# Patient Record
Sex: Female | Born: 1985 | Race: White | Hispanic: No | Marital: Married | Smoking: Never smoker
Health system: Southern US, Community
[De-identification: ages and names within clinical notes are randomized; demographics above are authoritative.]

## PROBLEM LIST (undated history)

## (undated) ENCOUNTER — Inpatient Hospital Stay (HOSPITAL_COMMUNITY): Payer: Self-pay

## (undated) DIAGNOSIS — J45909 Unspecified asthma, uncomplicated: Secondary | ICD-10-CM

## (undated) DIAGNOSIS — I10 Essential (primary) hypertension: Secondary | ICD-10-CM

## (undated) DIAGNOSIS — E119 Type 2 diabetes mellitus without complications: Secondary | ICD-10-CM

## (undated) HISTORY — PX: TONSILLECTOMY AND ADENOIDECTOMY: SHX28

---

## 2018-06-29 ENCOUNTER — Inpatient Hospital Stay (HOSPITAL_COMMUNITY)
Admission: AD | Admit: 2018-06-29 | Discharge: 2018-06-29 | Disposition: A | Discharge status: Home / Self Care | Payer: 59 | Source: Ambulatory Visit | Attending: Obstetrics and Gynecology | Admitting: Obstetrics and Gynecology

## 2018-06-29 ENCOUNTER — Encounter (HOSPITAL_COMMUNITY): Payer: Self-pay | Admitting: *Deleted

## 2018-06-29 DIAGNOSIS — O133 Gestational [pregnancy-induced] hypertension without significant proteinuria, third trimester: Secondary | ICD-10-CM | POA: Diagnosis present

## 2018-06-29 DIAGNOSIS — Z3A31 31 weeks gestation of pregnancy: Secondary | ICD-10-CM | POA: Diagnosis not present

## 2018-06-29 DIAGNOSIS — Z0371 Encounter for suspected problem with amniotic cavity and membrane ruled out: Secondary | ICD-10-CM

## 2018-06-29 DIAGNOSIS — O219 Vomiting of pregnancy, unspecified: Secondary | ICD-10-CM

## 2018-06-29 LAB — URINALYSIS, ROUTINE W REFLEX MICROSCOPIC
BILIRUBIN URINE: NEGATIVE
Glucose, UA: NEGATIVE mg/dL
Hgb urine dipstick: NEGATIVE
Ketones, ur: NEGATIVE mg/dL
LEUKOCYTES UA: NEGATIVE
NITRITE: NEGATIVE
PH: 7 (ref 5.0–8.0)
Protein, ur: NEGATIVE mg/dL
SPECIFIC GRAVITY, URINE: 1.006 (ref 1.005–1.030)

## 2018-06-29 LAB — COMPREHENSIVE METABOLIC PANEL
ALT: 13 U/L (ref 0–44)
AST: 17 U/L (ref 15–41)
Albumin: 2.5 g/dL — ABNORMAL LOW (ref 3.5–5.0)
Alkaline Phosphatase: 124 U/L (ref 38–126)
Anion gap: 9 (ref 5–15)
BUN: 5 mg/dL — AB (ref 6–20)
CHLORIDE: 107 mmol/L (ref 98–111)
CO2: 20 mmol/L — AB (ref 22–32)
CREATININE: 0.58 mg/dL (ref 0.44–1.00)
Calcium: 8.3 mg/dL — ABNORMAL LOW (ref 8.9–10.3)
Glucose, Bld: 81 mg/dL (ref 70–99)
Potassium: 3.6 mmol/L (ref 3.5–5.1)
Sodium: 136 mmol/L (ref 135–145)
Total Bilirubin: 0.3 mg/dL (ref 0.3–1.2)
Total Protein: 5.8 g/dL — ABNORMAL LOW (ref 6.5–8.1)

## 2018-06-29 LAB — CBC
HCT: 30.8 % — ABNORMAL LOW (ref 36.0–46.0)
HEMOGLOBIN: 10.1 g/dL — AB (ref 12.0–15.0)
MCH: 29.3 pg (ref 26.0–34.0)
MCHC: 32.8 g/dL (ref 30.0–36.0)
MCV: 89.3 fL (ref 78.0–100.0)
PLATELETS: 177 10*3/uL (ref 150–400)
RBC: 3.45 MIL/uL — AB (ref 3.87–5.11)
RDW: 13.8 % (ref 11.5–15.5)
WBC: 9.6 10*3/uL (ref 4.0–10.5)

## 2018-06-29 LAB — POCT FERN TEST: POCT Fern Test: NEGATIVE

## 2018-06-29 LAB — PROTEIN / CREATININE RATIO, URINE
CREATININE, URINE: 69 mg/dL
Protein Creatinine Ratio: 0.13 mg/mg{Cre} (ref 0.00–0.15)
Total Protein, Urine: 9 mg/dL

## 2018-06-29 MED ORDER — ACETAMINOPHEN 500 MG PO TABS
1000.0000 mg | ORAL_TABLET | Freq: Once | ORAL | Status: AC
  Administered 2018-06-29: 1000 mg via ORAL
  Filled 2018-06-29: qty 2

## 2018-06-29 NOTE — MAU Note (Addendum)
Ongoing, intermittent swelling, increased since last night and is hurting hands and legs  N/V, emesis x2 last night  SOB  Headache since last night, did not try any medication  +FM, reports underwear was wet this AM unsure if discharge or SROM

## 2018-06-29 NOTE — Discharge Instructions (Signed)
Hypertension During Pregnancy °Hypertension, commonly called high blood pressure, is when the force of blood pumping through your arteries is too strong. Arteries are blood vessels that carry blood from the heart throughout the body. Hypertension during pregnancy can cause problems for you and your baby. Your baby may be born early (prematurely) or may not weigh as much as he or she should at birth. Very bad cases of hypertension during pregnancy can be life-threatening. °Different types of hypertension can occur during pregnancy. These include: °· Chronic hypertension. This happens when: °? You have hypertension before pregnancy and it continues during pregnancy. °? You develop hypertension before you are [redacted] weeks pregnant, and it continues during pregnancy. °· Gestational hypertension. This is hypertension that develops after the 20th week of pregnancy. °· Preeclampsia, also called toxemia of pregnancy. This is a very serious type of hypertension that develops only during pregnancy. It affects the whole body, and it can be very dangerous for you and your baby. ° °Gestational hypertension and preeclampsia usually go away within 6 weeks after your baby is born. Women who have hypertension during pregnancy have a greater chance of developing hypertension later in life or during future pregnancies. °What are the causes? °The exact cause of hypertension is not known. °What increases the risk? °There are certain factors that make it more likely for you to develop hypertension during pregnancy. These include: °· Having hypertension during a previous pregnancy or prior to pregnancy. °· Being overweight. °· Being older than age 40. °· Being pregnant for the first time or being pregnant with more than one baby. °· Becoming pregnant using fertilization methods such as IVF (in vitro fertilization). °· Having diabetes, kidney problems, or systemic lupus erythematosus. °· Having a family history of hypertension. ° °What are the  signs or symptoms? °Chronic hypertension and gestational hypertension rarely cause symptoms. Preeclampsia causes symptoms, which may include: °· Increased protein in your urine. Your health care provider will check for this at every visit before you give birth (prenatal visit). °· Severe headaches. °· Sudden weight gain. °· Swelling of the hands, face, legs, and feet. °· Nausea and vomiting. °· Vision problems, such as blurred or double vision. °· Numbness in the face, arms, legs, and feet. °· Dizziness. °· Slurred speech. °· Sensitivity to bright lights. °· Abdominal pain. °· Convulsions. ° °How is this diagnosed? °You may be diagnosed with hypertension during a routine prenatal exam. At each prenatal visit, you may: °· Have a urine test to check for high amounts of protein in your urine. °· Have your blood pressure checked. A blood pressure reading is recorded as two numbers, such as "120 over 80" (or 120/80). The first ("top") number is called the systolic pressure. It is a measure of the pressure in your arteries when your heart beats. The second ("bottom") number is called the diastolic pressure. It is a measure of the pressure in your arteries as your heart relaxes between beats. Blood pressure is measured in a unit called mm Hg. A normal blood pressure reading is: °? Systolic: below 120. °? Diastolic: below 80. ° °The type of hypertension that you are diagnosed with depends on your test results and when your symptoms developed. °· Chronic hypertension is usually diagnosed before 20 weeks of pregnancy. °· Gestational hypertension is usually diagnosed after 20 weeks of pregnancy. °· Hypertension with high amounts of protein in the urine is diagnosed as preeclampsia. °· Blood pressure measurements that stay above 160 systolic, or above 110 diastolic, are   signs of severe preeclampsia. ° °How is this treated? °Treatment for hypertension during pregnancy varies depending on the type of hypertension you have and how  serious it is. °· If you take medicines called ACE inhibitors to treat chronic hypertension, you may need to switch medicines. ACE inhibitors should not be taken during pregnancy. °· If you have gestational hypertension, you may need to take blood pressure medicine. °· If you are at risk for preeclampsia, your health care provider may recommend that you take a low-dose aspirin every day to prevent high blood pressure during your pregnancy. °· If you have severe preeclampsia, you may need to be hospitalized so you and your baby can be monitored closely. You may also need to take medicine (magnesium sulfate) to prevent seizures and to lower blood pressure. This medicine may be given as an injection or through an IV tube. °· In some cases, if your condition gets worse, you may need to deliver your baby early. ° °Follow these instructions at home: °Eating and drinking °· Drink enough fluid to keep your urine clear or pale yellow. °· Eat a healthy diet that is low in salt (sodium). Do not add salt to your food. Check food labels to see how much sodium a food or beverage contains. °Lifestyle °· Do not use any products that contain nicotine or tobacco, such as cigarettes and e-cigarettes. If you need help quitting, ask your health care provider. °· Do not use alcohol. °· Avoid caffeine. °· Avoid stress as much as possible. Rest and get plenty of sleep. °General instructions °· Take over-the-counter and prescription medicines only as told by your health care provider. °· While lying down, lie on your left side. This keeps pressure off your baby. °· While sitting or lying down, raise (elevate) your feet. Try putting some pillows under your lower legs. °· Exercise regularly. Ask your health care provider what kinds of exercise are best for you. °· Keep all prenatal and follow-up visits as told by your health care provider. This is important. °Contact a health care provider if: °· You have symptoms that your health care  provider told you may require more treatment or monitoring, such as: °? Fever. °? Vomiting. °? Headache. °Get help right away if: °· You have severe abdominal pain or vomiting that does not get better with treatment. °· You suddenly develop swelling in your hands, ankles, or face. °· You gain 4 lbs (1.8 kg) or more in 1 week. °· You develop vaginal bleeding, or you have blood in your urine. °· You do not feel your baby moving as much as usual. °· You have blurred or double vision. °· You have muscle twitching or sudden tightening (spasms). °· You have shortness of breath. °· Your lips or fingernails turn blue. °This information is not intended to replace advice given to you by your health care provider. Make sure you discuss any questions you have with your health care provider. °Document Released: 06/26/2011 Document Revised: 04/27/2016 Document Reviewed: 03/23/2016 °Elsevier Interactive Patient Education © 2018 [employer] Inc. ° °

## 2018-06-29 NOTE — MAU Provider Note (Signed)
Chief Complaint:  Swelling; Shortness of Breath; Nausea; and Headache   First Provider Initiated Contact with Patient 06/29/18 1153     HPI: Paige Moreno is a 32 y.o. G2P0010 at [redacted]w[redacted]d who presents to maternity admissions reporting: 1. Moderate generalized HA since last night. Hasn't tried anything to Tx it  2. Swelling of  hands and feet. 3. Waking up w/ damp underwear. No leaking sine then 4. N/V x 2 last night accompanied by sensation of difficulty immediately before vomiting that resolved after vomiting. No N/V or difficulty breathing since then  Associated signs and symptoms: Neg for fever, chills, vision changes, epigastric pain, VB, contractions, vaginal discharge.   Good fetal movement.   Pregnancy Course: Had some mildly elevated BP's and Nml Pre-e labs.   Past Medical History:  Diagnosis Date  . Medical history non-contributory    OB History  Gravida Para Term Preterm AB Living  2       1    SAB TAB Ectopic Multiple Live Births  1            # Outcome Date GA Lbr Len/2nd Weight Sex Delivery Anes PTL Lv  2 Current           1 SAB 06/22/17           Past Surgical History:  Procedure Laterality Date  . TONSILLECTOMY AND ADENOIDECTOMY     History reviewed. No pertinent family history. Social History   Tobacco Use  . Smoking status: Never Smoker  . Smokeless tobacco: Never Used  Substance Use Topics  . Alcohol use: Never    Frequency: Never  . Drug use: Never   No Known Allergies No medications prior to admission.    I have reviewed patient's Past Medical Hx, Surgical Hx, Family Hx, Social Hx, medications and allergies.   ROS:  Review of Systems  Constitutional: Negative for chills and fever.  Eyes: Negative for photophobia and visual disturbance.  Respiratory: Negative for chest tightness.        Difficulty coordinating breathing w/ vomiting. Reported to RN as SOB, but not really C/W with true SOB. None since last episode of emesis.   Cardiovascular:  Positive for leg swelling. Negative for chest pain.  Gastrointestinal: Positive for nausea and vomiting. Negative for abdominal pain, constipation and diarrhea.  Genitourinary: Negative for vaginal bleeding and vaginal discharge.       Possible LOF.   Neurological: Positive for headaches. Negative for dizziness and speech difficulty.    Physical Exam   Patient Vitals for the past 24 hrs:  BP Temp Temp src Pulse Resp SpO2 Weight  06/29/18 1231 124/83 - - 90 - - -  06/29/18 1216 (!) 120/104 - - (!) 107 - - -  06/29/18 1201 (!) 127/103 - - (!) 109 - - -  06/29/18 1146 121/77 - - 91 - - -  06/29/18 1131 121/78 - - 94 - - -  06/29/18 1126 132/88 - - 90 - - -  06/29/18 1109 (!) 141/80 97.9 F (36.6 C) Oral (!) 108 20 97 % 91.2 kg   Constitutional: Well-developed, well-nourished female in no acute distress.  Cardiovascular: normal rate and mile tachycardia Respiratory: normal effort and rate GI: Abd soft, non-tender, gravid appropriate for gestational age. MS: Extremities nontender, 2+ pedal edema, normal ROM Neurologic: Alert and oriented x 4.  GU: Pelvic: NEFG, physiologic discharge, no blood, neg pooling, cervix clean, visually closed.     FHT:  Baseline 150 , moderate variability,  accelerations present, no decelerations Contractions: UI   Labs: Results for orders placed or performed during the hospital encounter of 06/29/18 (from the past 24 hour(s))  Urinalysis, Routine w reflex microscopic     Status: Abnormal   Collection Time: 06/29/18 11:30 AM  Result Value Ref Range   Color, Urine YELLOW YELLOW   APPearance HAZY (A) CLEAR   Specific Gravity, Urine 1.006 1.005 - 1.030   pH 7.0 5.0 - 8.0   Glucose, UA NEGATIVE NEGATIVE mg/dL   Hgb urine dipstick NEGATIVE NEGATIVE   Bilirubin Urine NEGATIVE NEGATIVE   Ketones, ur NEGATIVE NEGATIVE mg/dL   Protein, ur NEGATIVE NEGATIVE mg/dL   Nitrite NEGATIVE NEGATIVE   Leukocytes, UA NEGATIVE NEGATIVE  Protein / creatinine ratio, urine      Status: None   Collection Time: 06/29/18 11:30 AM  Result Value Ref Range   Creatinine, Urine 69.00 mg/dL   Total Protein, Urine 9 mg/dL   Protein Creatinine Ratio 0.13 0.00 - 0.15 mg/mg[Cre]  CBC     Status: Abnormal   Collection Time: 06/29/18 11:58 AM  Result Value Ref Range   WBC 9.6 4.0 - 10.5 K/uL   RBC 3.45 (L) 3.87 - 5.11 MIL/uL   Hemoglobin 10.1 (L) 12.0 - 15.0 g/dL   HCT 40.9 (L) 81.1 - 91.4 %   MCV 89.3 78.0 - 100.0 fL   MCH 29.3 26.0 - 34.0 pg   MCHC 32.8 30.0 - 36.0 g/dL   RDW 78.2 95.6 - 21.3 %   Platelets 177 150 - 400 K/uL  Comprehensive metabolic panel     Status: Abnormal   Collection Time: 06/29/18 11:58 AM  Result Value Ref Range   Sodium 136 135 - 145 mmol/L   Potassium 3.6 3.5 - 5.1 mmol/L   Chloride 107 98 - 111 mmol/L   CO2 20 (L) 22 - 32 mmol/L   Glucose, Bld 81 70 - 99 mg/dL   BUN 5 (L) 6 - 20 mg/dL   Creatinine, Ser 0.86 0.44 - 1.00 mg/dL   Calcium 8.3 (L) 8.9 - 10.3 mg/dL   Total Protein 5.8 (L) 6.5 - 8.1 g/dL   Albumin 2.5 (L) 3.5 - 5.0 g/dL   AST 17 15 - 41 U/L   ALT 13 0 - 44 U/L   Alkaline Phosphatase 124 38 - 126 U/L   Total Bilirubin 0.3 0.3 - 1.2 mg/dL   GFR calc non Af Amer >60 >60 mL/min   GFR calc Af Amer >60 >60 mL/min   Anion gap 9 5 - 15  POCT fern test     Status: None   Collection Time: 06/29/18 12:12 PM  Result Value Ref Range   POCT Fern Test Negative = intact amniotic membranes     Imaging:  No results found.  MAU Course: Orders Placed This Encounter  Procedures  . Urinalysis, Routine w reflex microscopic  . CBC  . Comprehensive metabolic panel  . Protein / creatinine ratio, urine  . Vital signs  . Encourage fluids  . POCT fern test  . Discharge patient   Meds ordered this encounter  Medications  . acetaminophen (TYLENOL) tablet 1,000 mg   Discussed Hx, labs, BPs, exam w/ Dr. Dr. Ellyn Hack. Agrees w/ POC. New orders: None.   MDM: - Mildly elevated BP's w/ Nml Pre-E labs and HA that resolved w/ Tylenol C/W  CHTN. Swelling likely related. No evidence of DVT.  - No evidence of ROM. Sensation of damp underwear may have been from leukorrhea.  PROM precautions. Reviewed.  - Sporadic N/V x ~1 month. Declines meds. Doubt infectious etiology or other emergent condition. Possibly 2/2 reflux. Discussed meds, diet.   Assessment: 1. Gestational hypertension, third trimester   2. No leakage of amniotic fluid into vagina   3. Pregnancy related nausea and vomiting, antepartum     Plan: Discharge home in stable condition per consult w/ Dr. Ellyn Hack.  Preterm Labor precautions, Pre-E precautions and fetal kick counts Follow-up Information    [facility] Follow up on 06/30/2018.   Contact information: [address]  [address] [place] [address] [phone]        [facility] [place] MATERNITY ADMISSIONS Follow up.   Why:  as needed if symptoms worsen Contact information: [address] [id number] mc [place] [address] [phone]          Allergies as of 06/29/2018   No Known Allergies     Medication List    You have not been prescribed any medications.     [place], [place], CNM 06/29/2018 2:24 PM

## 2018-07-15 ENCOUNTER — Inpatient Hospital Stay (HOSPITAL_COMMUNITY)
Admission: AD | Admit: 2018-07-15 | Discharge: 2018-07-15 | Disposition: A | Discharge status: Home / Self Care | Payer: 59 | Source: Ambulatory Visit | Attending: Obstetrics and Gynecology | Admitting: Obstetrics and Gynecology

## 2018-07-15 ENCOUNTER — Other Ambulatory Visit: Payer: Self-pay

## 2018-07-15 ENCOUNTER — Encounter (HOSPITAL_COMMUNITY): Payer: Self-pay

## 2018-07-15 DIAGNOSIS — Z3A33 33 weeks gestation of pregnancy: Secondary | ICD-10-CM | POA: Insufficient documentation

## 2018-07-15 DIAGNOSIS — O133 Gestational [pregnancy-induced] hypertension without significant proteinuria, third trimester: Secondary | ICD-10-CM | POA: Diagnosis not present

## 2018-07-15 DIAGNOSIS — R030 Elevated blood-pressure reading, without diagnosis of hypertension: Secondary | ICD-10-CM | POA: Diagnosis present

## 2018-07-15 HISTORY — DX: Unspecified asthma, uncomplicated: J45.909

## 2018-07-15 LAB — COMPREHENSIVE METABOLIC PANEL
ALT: 15 U/L (ref 0–44)
AST: 23 U/L (ref 15–41)
Albumin: 2.7 g/dL — ABNORMAL LOW (ref 3.5–5.0)
Alkaline Phosphatase: 140 U/L — ABNORMAL HIGH (ref 38–126)
Anion gap: 8 (ref 5–15)
BUN: 7 mg/dL (ref 6–20)
CHLORIDE: 109 mmol/L (ref 98–111)
CO2: 22 mmol/L (ref 22–32)
Calcium: 8.6 mg/dL — ABNORMAL LOW (ref 8.9–10.3)
Creatinine, Ser: 0.52 mg/dL (ref 0.44–1.00)
GFR calc Af Amer: >60 mL/min (ref >60)
Glucose, Bld: 82 mg/dL (ref 70–99)
POTASSIUM: 4.2 mmol/L (ref 3.5–5.1)
SODIUM: 139 mmol/L (ref 135–145)
Total Bilirubin: 0.5 mg/dL (ref 0.3–1.2)
Total Protein: 5.7 g/dL — ABNORMAL LOW (ref 6.5–8.1)

## 2018-07-15 LAB — CBC WITH DIFFERENTIAL/PLATELET
Basophils Absolute: 0 10*3/uL (ref 0.0–0.1)
Basophils Relative: 0 %
EOS PCT: 1 %
Eosinophils Absolute: 0.1 10*3/uL (ref 0.0–0.7)
HCT: 31.6 % — ABNORMAL LOW (ref 36.0–46.0)
Hemoglobin: 10.4 g/dL — ABNORMAL LOW (ref 12.0–15.0)
LYMPHS ABS: 1.7 10*3/uL (ref 0.7–4.0)
LYMPHS PCT: 19 %
MCH: 29.1 pg (ref 26.0–34.0)
MCHC: 32.9 g/dL (ref 30.0–36.0)
MCV: 88.3 fL (ref 78.0–100.0)
MONO ABS: 0.5 10*3/uL (ref 0.1–1.0)
Monocytes Relative: 6 %
Neutro Abs: 6.7 10*3/uL (ref 1.7–7.7)
Neutrophils Relative %: 74 %
PLATELETS: 174 10*3/uL (ref 150–400)
RBC: 3.58 MIL/uL — AB (ref 3.87–5.11)
RDW: 14.5 % (ref 11.5–15.5)
WBC: 9 10*3/uL (ref 4.0–10.5)

## 2018-07-15 LAB — URINALYSIS, ROUTINE W REFLEX MICROSCOPIC
Bilirubin Urine: NEGATIVE
Glucose, UA: NEGATIVE mg/dL
Hgb urine dipstick: NEGATIVE
KETONES UR: NEGATIVE mg/dL
LEUKOCYTES UA: NEGATIVE
NITRITE: NEGATIVE
PH: 7 (ref 5.0–8.0)
Protein, ur: NEGATIVE mg/dL
SPECIFIC GRAVITY, URINE: 1.008 (ref 1.005–1.030)

## 2018-07-15 LAB — PROTEIN / CREATININE RATIO, URINE
CREATININE, URINE: 63 mg/dL
Protein Creatinine Ratio: 0.14 mg/mg{Cre} (ref 0.00–0.15)
TOTAL PROTEIN, URINE: 9 mg/dL

## 2018-07-15 MED ORDER — LABETALOL HCL 100 MG PO TABS
200.0000 mg | ORAL_TABLET | Freq: Once | ORAL | Status: AC
  Administered 2018-07-15: 200 mg via ORAL
  Filled 2018-07-15: qty 2

## 2018-07-15 MED ORDER — ONDANSETRON 8 MG PO TBDP
8.0000 mg | ORAL_TABLET | Freq: Once | ORAL | Status: AC
  Administered 2018-07-15: 8 mg via ORAL
  Filled 2018-07-15: qty 1

## 2018-07-15 MED ORDER — LABETALOL HCL 5 MG/ML IV SOLN
20.0000 mg | INTRAVENOUS | Status: DC | PRN
  Administered 2018-07-15: 20 mg via INTRAVENOUS
  Filled 2018-07-15: qty 4

## 2018-07-15 MED ORDER — LACTATED RINGERS IV SOLN
INTRAVENOUS | Status: DC
  Administered 2018-07-15: 18:00:00 via INTRAVENOUS

## 2018-07-15 MED ORDER — ACETAMINOPHEN 500 MG PO TABS
1000.0000 mg | ORAL_TABLET | Freq: Once | ORAL | Status: AC
  Administered 2018-07-15: 1000 mg via ORAL
  Filled 2018-07-15: qty 2

## 2018-07-15 MED ORDER — LABETALOL HCL 200 MG PO TABS: 200.0000 mg | ORAL_TABLET | Freq: Two times a day (BID) | ORAL | 1 refills | Status: DC

## 2018-07-15 MED ORDER — LABETALOL HCL 5 MG/ML IV SOLN: 80.0000 mg | INTRAVENOUS | Status: DC | PRN

## 2018-07-15 MED ORDER — HYDRALAZINE HCL 20 MG/ML IJ SOLN: 10.0000 mg | INTRAMUSCULAR | Status: DC | PRN

## 2018-07-15 MED ORDER — LABETALOL HCL 5 MG/ML IV SOLN
40.0000 mg | INTRAVENOUS | Status: DC | PRN
  Filled 2018-07-15: qty 8

## 2018-07-15 NOTE — Discharge Instructions (Signed)
Hypertension During Pregnancy °Hypertension is also called high blood pressure. High blood pressure means that the force of your blood moving in your body is too strong. When you are pregnant, this condition should be watched carefully. It can cause problems for you and your baby. °Follow these instructions at home: °Eating and drinking °· Drink enough fluid to keep your pee (urine) clear or pale yellow. °· Eat healthy foods that are low in salt (sodium). °? Do not add salt to your food. °? Check labels on foods and drinks to see much salt is in them. Look on the label where you see "Sodium." °Lifestyle °· Do not use any products that contain nicotine or tobacco, such as cigarettes and e-cigarettes. If you need help quitting, ask your doctor. °· Do not use alcohol. °· Avoid caffeine. °· Avoid stress. Rest and get plenty of sleep. °General instructions °· Take over-the-counter and prescription medicines only as told by your doctor. °· While lying down, lie on your left side. This keeps pressure off your baby. °· While sitting or lying down, raise (elevate) your feet. Try putting some pillows under your lower legs. °· Exercise regularly. Ask your doctor what kinds of exercise are best for you. °· Keep all prenatal and follow-up visits as told by your doctor. This is important. °Contact a doctor if: °· You have symptoms that your doctor told you to watch for, such as: °? Fever. °? Throwing up (vomiting). °? Headache. °Get help right away if: °· You have very bad pain in your belly (abdomen). °· You are throwing up, and this does not get better with treatment. °· You suddenly get swelling in your hands, ankles, or face. °· You gain 4 lb (1.8 kg) or more in 1 week. °· You get bleeding from your vagina. °· You have blood in your pee. °· You do not feel your baby moving as much as normal. °· You have a change in vision. °· You have muscle twitching or sudden tightening (spasms). °· You have trouble breathing. °· Your lips  or fingernails turn blue. °This information is not intended to replace advice given to you by your health care provider. Make sure you discuss any questions you have with your health care provider. °Document Released: 11/10/2010 Document Revised: 06/19/2016 Document Reviewed: 06/19/2016 °Elsevier Interactive Patient Education © 2018 [employer] Inc[employer] °Preeclampsia and Eclampsia °Preeclampsia is a serious condition that develops only during pregnancy. It is also called toxemia of pregnancy. This condition causes high blood pressure along with other symptoms, such as swelling and headaches. These symptoms may develop as the condition gets worse. Preeclampsia may occur at 20 weeks of pregnancy or later. °Diagnosing and treating preeclampsia early is very important. If not treated early, it can cause serious problems for you and your baby. One problem it can lead to is eclampsia, which is a condition that causes muscle jerking or shaking (convulsions or seizures) in the mother. Delivering your baby is the best treatment for preeclampsia or eclampsia. Preeclampsia and eclampsia symptoms usually go away after your baby is born. °What are the causes? °The cause of preeclampsia is not known. °What increases the risk? °The following risk factors make you more likely to develop preeclampsia: °· Being pregnant for the first time. °· Having had preeclampsia during a past pregnancy. °· Having a family history of preeclampsia. °· Having high blood pressure. °· Being pregnant with twins or triplets. °· Being 35 or older. °· Being African-American. °· Having kidney disease or diabetes. °·   Having medical conditions such as lupus or blood diseases. °· Being very overweight (obese). ° °What are the signs or symptoms? °The earliest signs of preeclampsia are: °· High blood pressure. °· Increased protein in your urine. Your health care provider will check for this at every visit before you give birth (prenatal visit). ° °Other symptoms that  may develop as the condition gets worse include: °· Severe headaches. °· Sudden weight gain. °· Swelling of the hands, face, legs, and feet. °· Nausea and vomiting. °· Vision problems, such as blurred or double vision. °· Numbness in the face, arms, legs, and feet. °· Urinating less than usual. °· Dizziness. °· Slurred speech. °· Abdominal pain, especially upper abdominal pain. °· Convulsions or seizures. ° °Symptoms generally go away after giving birth. °How is this diagnosed? °There are no screening tests for preeclampsia. Your health care provider will ask you about symptoms and check for signs of preeclampsia during your prenatal visits. You may also have tests that include: °· Urine tests. °· Blood tests. °· Checking your blood pressure. °· Monitoring your baby’s heart rate. °· Ultrasound. ° °How is this treated? °You and your health care provider will determine the treatment approach that is best for you. Treatment may include: °· Having more frequent prenatal exams to check for signs of preeclampsia, if you have an increased risk for preeclampsia. °· Bed rest. °· Reducing how much salt (sodium) you eat. °· Medicine to lower your blood pressure. °· Staying in the hospital, if your condition is severe. There, treatment will focus on controlling your blood pressure and the amount of fluids in your body (fluid retention). °· You may need to take medicine (magnesium sulfate) to prevent seizures. This medicine may be given as an injection or through an IV tube. °· Delivering your baby early, if your condition gets worse. You may have your labor started with medicine (induced), or you may have a cesarean delivery. ° °Follow these instructions at home: °Eating and drinking ° °· Drink enough fluid to keep your urine clear or pale yellow. °· Eat a healthy diet that is low in sodium. Do not add salt to your food. Check nutrition labels to see how much sodium a food or beverage contains. °· Avoid  caffeine. °Lifestyle °· Do not use any products that contain nicotine or tobacco, such as cigarettes and e-cigarettes. If you need help quitting, ask your health care provider. °· Do not use alcohol or drugs. °· Avoid stress as much as possible. Rest and get plenty of sleep. °General instructions °· Take over-the-counter and prescription medicines only as told by your health care provider. °· When lying down, lie on your side. This keeps pressure off of your baby. °· When sitting or lying down, raise (elevate) your feet. Try putting some pillows underneath your lower legs. °· Exercise regularly. Ask your health care provider what kinds of exercise are best for you. °· Keep all follow-up and prenatal visits as told by your health care provider. This is important. °How is this prevented? °To prevent preeclampsia or eclampsia from developing during another pregnancy: °· Get proper medical care during pregnancy. Your health care provider may be able to prevent preeclampsia or diagnose and treat it early. °· Your health care provider may have you take a low-dose aspirin or a calcium supplement during your next pregnancy. °· You may have tests of your blood pressure and kidney function after giving birth. °· Maintain a healthy weight. Ask your health care provider for   help managing weight gain during pregnancy. °· Work with your health care provider to manage any long-term (chronic) health conditions you have, such as diabetes or kidney problems. ° °Contact a health care provider if: °· You gain more weight than expected. °· You have headaches. °· You have nausea or vomiting. °· You have abdominal pain. °· You feel dizzy or light-headed. °Get help right away if: °· You develop sudden or severe swelling anywhere in your body. This usually happens in the legs. °· You gain 5 lbs (2.3 kg) or more during one week. °· You have severe: °? Abdominal pain. °? Headaches. °? Dizziness. °? Vision problems. °? Confusion. °? Nausea or  vomiting. °· You have a seizure. °· You have trouble moving any part of your body. °· You develop numbness in any part of your body. °· You have trouble speaking. °· You have any abnormal bleeding. °· You pass out. °This information is not intended to replace advice given to you by your health care provider. Make sure you discuss any questions you have with your health care provider. °Document Released: 10/05/2000 Document Revised: 06/05/2016 Document Reviewed: 05/14/2016 °Elsevier Interactive Patient Education © 2018 Elsevier Inc. ° °

## 2018-07-15 NOTE — MAU Note (Signed)
Feels really nauseous, when stands sees spotty dots, feels dizzy, has a HA.   Increased swelling in lower extremities. Ankles tight and red. (pitting edema noted)- swelling has been going down for the last 2 wks.   BP has been up and down.  Sent over from dr's office

## 2018-07-15 NOTE — MAU Provider Note (Signed)
History     CSN: [id number]  Arrival date and time: 07/15/18 1604   First Provider Initiated Contact with Patient 07/15/18 1641      Chief Complaint  Patient presents with  . Headache  . Nausea  . Leg Swelling  . Dizziness  . visual changes   HPI  Ms. Paige Moreno is a 32 y.o. G2P0010 at [redacted]w[redacted]d who presents to MAU today from the office for further evaluation of elevated blood pressure. The patient also has mild headache. She denies floaters or blurred vision. She has noted intermittent upper abdominal pain and increasing LE edema. She was diagnosed with GHTN after her last MAU visit on 06/29/18. She is not currently on any anti-hypertensive medications.   OB History    Gravida  2   Para      Term      Preterm      AB  1   Living        SAB  1   TAB      Ectopic      Multiple      Live Births              Past Medical History:  Diagnosis Date  . Asthma     Past Surgical History:  Procedure Laterality Date  . TONSILLECTOMY AND ADENOIDECTOMY      History reviewed. No pertinent family history.  Social History   Tobacco Use  . Smoking status: Never Smoker  . Smokeless tobacco: Never Used  Substance Use Topics  . Alcohol use: Never    Frequency: Never  . Drug use: Never    Allergies: No Known Allergies  No medications prior to admission.    Review of Systems  Constitutional: Negative for fever.  Eyes: Negative for visual disturbance.  Cardiovascular: Positive for leg swelling.  Gastrointestinal: Positive for abdominal pain, nausea and vomiting. Negative for constipation and diarrhea.  Genitourinary: Positive for vaginal discharge. Negative for vaginal bleeding.  Neurological: Positive for headaches.   Physical Exam   Blood pressure (!) 138/95, pulse 85, temperature 98.6 F (37 C), temperature source Oral, resp. rate 20, weight 96.5 kg, SpO2 100 %.  Physical Exam  Nursing note and vitals reviewed. Constitutional: She is oriented to  person, place, and time. She appears well-developed and well-nourished. No distress.  HENT:  Head: Normocephalic and atraumatic.  Cardiovascular: Normal rate.  Respiratory: Effort normal.  GI: Soft. She exhibits no distension and no mass. There is tenderness (mild epigastric tenderness to palpation). There is no rebound and no guarding.  Musculoskeletal: She exhibits edema (2+ pitting edema to the mid shin bilaterally).  Neurological: She is alert and oriented to person, place, and time. She has normal reflexes.  No clonus  Skin: Skin is warm and dry. No erythema.  Psychiatric: She has a normal mood and affect.     Results for orders placed or performed during the hospital encounter of 07/15/18 (from the past 24 hour(s))  CBC with Differential/Platelet     Status: Abnormal   Collection Time: 07/15/18  4:49 PM  Result Value Ref Range   WBC 9.0 4.0 - 10.5 K/uL   RBC 3.58 (L) 3.87 - 5.11 MIL/uL   Hemoglobin 10.4 (L) 12.0 - 15.0 g/dL   HCT 78.2 (L) 95.6 - 21.3 %   MCV 88.3 78.0 - 100.0 fL   MCH 29.1 26.0 - 34.0 pg   MCHC 32.9 30.0 - 36.0 g/dL   RDW 08.6 57.8 - 46.9 %  Platelets 174 150 - 400 K/uL   Neutrophils Relative % 74 %   Neutro Abs 6.7 1.7 - 7.7 K/uL   Lymphocytes Relative 19 %   Lymphs Abs 1.7 0.7 - 4.0 K/uL   Monocytes Relative 6 %   Monocytes Absolute 0.5 0.1 - 1.0 K/uL   Eosinophils Relative 1 %   Eosinophils Absolute 0.1 0.0 - 0.7 K/uL   Basophils Relative 0 %   Basophils Absolute 0.0 0.0 - 0.1 K/uL  Comprehensive metabolic panel     Status: Abnormal   Collection Time: 07/15/18  4:49 PM  Result Value Ref Range   Sodium 139 135 - 145 mmol/L   Potassium 4.2 3.5 - 5.1 mmol/L   Chloride 109 98 - 111 mmol/L   CO2 22 22 - 32 mmol/L   Glucose, Bld 82 70 - 99 mg/dL   BUN 7 6 - 20 mg/dL   Creatinine, Ser 1.610.52 0.44 - 1.00 mg/dL   Calcium 8.6 (L) 8.9 - 10.3 mg/dL   Total Protein 5.7 (L) 6.5 - 8.1 g/dL   Albumin 2.7 (L) 3.5 - 5.0 g/dL   AST 23 15 - 41 U/L   ALT 15 0 -  44 U/L   Alkaline Phosphatase 140 (H) 38 - 126 U/L   Total Bilirubin 0.5 0.3 - 1.2 mg/dL   GFR calc non Af Amer >60 >60 mL/min   GFR calc Af Amer >60 >60 mL/min   Anion gap 8 5 - 15  Protein / creatinine ratio, urine     Status: None   Collection Time: 07/15/18  4:56 PM  Result Value Ref Range   Creatinine, Urine 63.00 mg/dL   Total Protein, Urine 9 mg/dL   Protein Creatinine Ratio 0.14 0.00 - 0.15 mg/mg[Cre]  Urinalysis, Routine w reflex microscopic     Status: None   Collection Time: 07/15/18  4:56 PM  Result Value Ref Range   Color, Urine YELLOW YELLOW   APPearance CLEAR CLEAR   Specific Gravity, Urine 1.008 1.005 - 1.030   pH 7.0 5.0 - 8.0   Glucose, UA NEGATIVE NEGATIVE mg/dL   Hgb urine dipstick NEGATIVE NEGATIVE   Bilirubin Urine NEGATIVE NEGATIVE   Ketones, ur NEGATIVE NEGATIVE mg/dL   Protein, ur NEGATIVE NEGATIVE mg/dL   Nitrite NEGATIVE NEGATIVE   Leukocytes, UA NEGATIVE NEGATIVE    Fetal Monitoring: Baseline: 130 bpm Variability: moderate Accelerations: 10 x 10, 15 x 15 Decelerations: none Contractions: none  Patient Vitals for the past 24 hrs:  BP Temp Temp src Pulse Resp SpO2 Weight  07/15/18 1756 (!) 138/95 - - 85 - - -  07/15/18 1751 (!) 134/94 - - 85 - - -  07/15/18 1716 (!) 152/112 - - (!) 103 - - -  07/15/18 1701 (!) 149/96 - - (!) 101 - - -  07/15/18 1646 (!) 148/110 - - (!) 105 - - -  07/15/18 1626 (!) 150/101 98.6 F (37 C) Oral 99 20 100 % 96.5 kg    MAU Course  Procedures None  MDM UA, urine protein/creatinine ratio, CBC, CMP  Serial BPs Second severe range BP. HTN protocol initiated.  Zofran ODT for nausea and 1 G Tylenol for headache given Patient reports resolution of headache.  Discussed patient with Dr. Jackelyn KnifeMeisinger. Start on Labetalol 200 mg BID. Follow-up in the office Thursday for BP check.   Assessment and Plan  A: SIUP at 4162w6d GTHN  P:  Discharge home Rx for Labetalol given to patient  Pre-eclampsia precautions  discussed Patient advised to follow-up with [facility] on Thursday for BP check and re-evaluation.  Patient may return to MAU as needed or if her condition were to change or worsen  Vonzella Nipple, PA-C 07/15/2018, 6:17 PM

## 2018-07-16 ENCOUNTER — Ambulatory Visit: Payer: 59 | Admitting: Registered"

## 2018-07-16 ENCOUNTER — Encounter: Payer: Self-pay | Admitting: *Deleted

## 2018-07-16 ENCOUNTER — Ambulatory Visit (INDEPENDENT_AMBULATORY_CARE_PROVIDER_SITE_OTHER): Payer: 59 | Admitting: *Deleted

## 2018-07-16 MED ORDER — BETAMETHASONE SOD PHOS & ACET 6 (3-3) MG/ML IJ SUSP
12.0000 mg | INTRAMUSCULAR | Status: AC
  Administered 2018-07-16 – 2018-07-17 (×2): 12 mg via INTRAMUSCULAR

## 2018-07-17 ENCOUNTER — Ambulatory Visit (INDEPENDENT_AMBULATORY_CARE_PROVIDER_SITE_OTHER): Payer: 59 | Admitting: General Practice

## 2018-07-17 VITALS — BP 142/74 | HR 123

## 2018-07-17 DIAGNOSIS — O09213 Supervision of pregnancy with history of pre-term labor, third trimester: Secondary | ICD-10-CM

## 2018-07-17 NOTE — Progress Notes (Signed)
Paige Moreno here for Betamethasone  Injection.  Injection administered without complication. Patient will follow up at [facility] provider as previously scheduled.  Marylynn Pearson, RN 07/17/2018  2:29 PM

## 2018-07-17 NOTE — Progress Notes (Signed)
Patient seen and assessed by nursing staff.  Agree with documentation and plan.  

## 2018-07-18 ENCOUNTER — Encounter (HOSPITAL_COMMUNITY): Payer: Self-pay

## 2018-07-18 ENCOUNTER — Inpatient Hospital Stay (EMERGENCY_DEPARTMENT_HOSPITAL)
Admission: AD | Admit: 2018-07-18 | Discharge: 2018-07-19 | Disposition: A | Discharge status: Home / Self Care | Payer: 59 | Source: Ambulatory Visit | Attending: Obstetrics and Gynecology | Admitting: Obstetrics and Gynecology

## 2018-07-18 DIAGNOSIS — O133 Gestational [pregnancy-induced] hypertension without significant proteinuria, third trimester: Secondary | ICD-10-CM | POA: Insufficient documentation

## 2018-07-18 DIAGNOSIS — R109 Unspecified abdominal pain: Secondary | ICD-10-CM | POA: Insufficient documentation

## 2018-07-18 DIAGNOSIS — R51 Headache: Secondary | ICD-10-CM

## 2018-07-18 DIAGNOSIS — Z3A38 38 weeks gestation of pregnancy: Secondary | ICD-10-CM

## 2018-07-18 DIAGNOSIS — O26899 Other specified pregnancy related conditions, unspecified trimester: Secondary | ICD-10-CM

## 2018-07-18 DIAGNOSIS — Z3A34 34 weeks gestation of pregnancy: Secondary | ICD-10-CM | POA: Insufficient documentation

## 2018-07-18 DIAGNOSIS — O26893 Other specified pregnancy related conditions, third trimester: Secondary | ICD-10-CM

## 2018-07-18 DIAGNOSIS — R519 Headache, unspecified: Secondary | ICD-10-CM

## 2018-07-18 DIAGNOSIS — R42 Dizziness and giddiness: Secondary | ICD-10-CM

## 2018-07-18 HISTORY — DX: Essential (primary) hypertension: I10

## 2018-07-18 HISTORY — DX: Type 2 diabetes mellitus without complications: E11.9

## 2018-07-18 LAB — URINALYSIS, ROUTINE W REFLEX MICROSCOPIC
Bilirubin Urine: NEGATIVE
Glucose, UA: NEGATIVE mg/dL
Hgb urine dipstick: NEGATIVE
Ketones, ur: NEGATIVE mg/dL
Leukocytes, UA: NEGATIVE
NITRITE: NEGATIVE
PROTEIN: NEGATIVE mg/dL
Specific Gravity, Urine: 1.01 (ref 1.005–1.030)
pH: 7 (ref 5.0–8.0)

## 2018-07-18 MED ORDER — LACTATED RINGERS IV BOLUS (SEPSIS)
1000.0000 mL | Freq: Once | INTRAVENOUS | Status: AC
  Administered 2018-07-19: 1000 mL via INTRAVENOUS

## 2018-07-18 MED ORDER — METOCLOPRAMIDE HCL 5 MG/ML IJ SOLN
10.0000 mg | Freq: Once | INTRAMUSCULAR | Status: AC
  Administered 2018-07-19: 10 mg via INTRAVENOUS
  Filled 2018-07-18: qty 2

## 2018-07-18 MED ORDER — DIPHENHYDRAMINE HCL 50 MG/ML IJ SOLN
25.0000 mg | Freq: Once | INTRAMUSCULAR | Status: AC
  Administered 2018-07-19: 25 mg via INTRAVENOUS
  Filled 2018-07-18: qty 1

## 2018-07-18 MED ORDER — DEXAMETHASONE SODIUM PHOSPHATE 10 MG/ML IJ SOLN
10.0000 mg | Freq: Once | INTRAMUSCULAR | Status: AC
  Administered 2018-07-19: 10 mg via INTRAVENOUS
  Filled 2018-07-18: qty 1

## 2018-07-18 NOTE — MAU Provider Note (Signed)
Chief Complaint:  Abdominal Pain and Nausea   First Provider Initiated Contact with Patient 07/18/18 2333      HPI: Paige Moreno is a 32 y.o. G2P0010 at 21w2dwho presents to maternity admissions reporting dizzines and intermittent h/a x 2 weeks with new onset upper abdominal pain and nausea/vomiting today. She is treated for [facility] with labetalol 100 mg BID.  She reports h/a now is frontal, constant, unchanged in intensity since onset a few hours ago.  Her abdominal pain is in her upper abdomen, below her ribs, across her entire abdomen bilaterally, sharp shooting pain every few minutes. She reports constant nausea today with vomiting x 1. She has not tried any treatments. Her dizziness is with standing, it improves with rest. There are no other associated symptoms. She reports good fetal movement.  HPI  Past Medical History: Past Medical History:  Diagnosis Date  . Asthma   . Diabetes mellitus without complication (HCC)    GDM  . Hypertension    GHTN    Past obstetric history: OB History  Gravida Para Term Preterm AB Living  2       1    SAB TAB Ectopic Multiple Live Births  1            # Outcome Date GA Lbr Len/2nd Weight Sex Delivery Anes PTL Lv  2 Current           1 SAB 06/22/17            Past Surgical History: Past Surgical History:  Procedure Laterality Date  . TONSILLECTOMY AND ADENOIDECTOMY      Family History: No family history on file.  Social History: Social History   Tobacco Use  . Smoking status: Never Smoker  . Smokeless tobacco: Never Used  Substance Use Topics  . Alcohol use: Never    Frequency: Never  . Drug use: Never    Allergies: No Known Allergies  Meds:  No medications prior to admission.    ROS:  Review of Systems  Constitutional: Negative for chills, fatigue and fever.  Eyes: Negative for visual disturbance.  Respiratory: Negative for shortness of breath.   Cardiovascular: Negative for chest pain.  Gastrointestinal: Positive  for abdominal pain, nausea and vomiting.  Genitourinary: Negative for difficulty urinating, dysuria, flank pain, pelvic pain, vaginal bleeding, vaginal discharge and vaginal pain.  Neurological: Positive for dizziness and headaches.  Psychiatric/Behavioral: Negative.      I have reviewed patient's Past Medical Hx, Surgical Hx, Family Hx, Social Hx, medications and allergies.   Physical Exam   Patient Vitals for the past 24 hrs:  BP Temp Temp src Pulse Resp SpO2 Height Weight  07/19/18 0234 137/83 - - 62 - - - -  07/19/18 0101 (!) 129/114 - - 80 - - - -  07/19/18 0046 140/85 - - 78 - - - -  07/19/18 0001 (!) 147/90 - - 66 - - - -  07/18/18 2346 (!) 133/100 - - 70 - - - -  07/18/18 2316 (!) 143/91 - - 67 - - - -  07/18/18 2300 132/89 - - 77 - - - -  07/18/18 2248 (!) 146/95 98.2 F (36.8 C) Oral 71 20 100 % 5\' 1"  (1.549 m) -  07/18/18 2229 - - - - - - - 97.6 kg   Constitutional: Well-developed, well-nourished female in no acute distress.  HEART: normal rate, heart sounds, regular rhythm RESP: normal effort, lung sounds clear and equal bilaterally GI: Abd soft, non-tender,  gravid appropriate for gestational age.  MS: Extremities nontender, no edema, normal ROM Neurologic: Alert and oriented x 4.  GU: Neg CVAT.     FHT:  Baseline 150 , moderate variability, accelerations present, no decelerations Contractions: irregular/rare, mild to palpation   Labs: Results for orders placed or performed during the hospital encounter of 07/18/18 (from the past 24 hour(s))  Urinalysis, Routine w reflex microscopic     Status: Abnormal   Collection Time: 07/18/18 10:34 PM  Result Value Ref Range   Color, Urine YELLOW YELLOW   APPearance HAZY (A) CLEAR   Specific Gravity, Urine 1.010 1.005 - 1.030   pH 7.0 5.0 - 8.0   Glucose, UA NEGATIVE NEGATIVE mg/dL   Hgb urine dipstick NEGATIVE NEGATIVE   Bilirubin Urine NEGATIVE NEGATIVE   Ketones, ur NEGATIVE NEGATIVE mg/dL   Protein, ur NEGATIVE  NEGATIVE mg/dL   Nitrite NEGATIVE NEGATIVE   Leukocytes, UA NEGATIVE NEGATIVE   RBC / HPF 0-5 0 - 5 RBC/hpf   WBC, UA 0-5 0 - 5 WBC/hpf   Bacteria, UA MANY (A) NONE SEEN   Squamous Epithelial / LPF 0-5 0 - 5   Mucus PRESENT   Protein / creatinine ratio, urine     Status: Abnormal   Collection Time: 07/18/18 10:34 PM  Result Value Ref Range   Creatinine, Urine 82.00 mg/dL   Total Protein, Urine 16 mg/dL   Protein Creatinine Ratio 0.20 (H) 0.00 - 0.15 mg/mg[Cre]  CBC     Status: Abnormal   Collection Time: 07/19/18 12:34 AM  Result Value Ref Range   WBC 12.9 (H) 4.0 - 10.5 K/uL   RBC 3.43 (L) 3.87 - 5.11 MIL/uL   Hemoglobin 10.0 (L) 12.0 - 15.0 g/dL   HCT 16.1 (L) 09.6 - 04.5 %   MCV 91.3 78.0 - 100.0 fL   MCH 29.2 26.0 - 34.0 pg   MCHC 31.9 30.0 - 36.0 g/dL   RDW 40.9 81.1 - 91.4 %   Platelets 164 150 - 400 K/uL  Comprehensive metabolic panel     Status: Abnormal   Collection Time: 07/19/18 12:34 AM  Result Value Ref Range   Sodium 139 135 - 145 mmol/L   Potassium 4.3 3.5 - 5.1 mmol/L   Chloride 110 98 - 111 mmol/L   CO2 21 (L) 22 - 32 mmol/L   Glucose, Bld 97 70 - 99 mg/dL   BUN 10 6 - 20 mg/dL   Creatinine, Ser 7.82 0.44 - 1.00 mg/dL   Calcium 8.5 (L) 8.9 - 10.3 mg/dL   Total Protein 5.7 (L) 6.5 - 8.1 g/dL   Albumin 2.8 (L) 3.5 - 5.0 g/dL   AST 41 15 - 41 U/L   ALT 20 0 - 44 U/L   Alkaline Phosphatase 137 (H) 38 - 126 U/L   Total Bilirubin 1.0 0.3 - 1.2 mg/dL   GFR calc non Af Amer >60 >60 mL/min   GFR calc Af Amer >60 >60 mL/min   Anion gap 8 5 - 15      Imaging:  No results found.  MAU Course/MDM: I have ordered labs and reviewed results.  NST reviewed and reactive CBC, CMP, P/C ratio wnl, no evidence of preeclampsia No protein in urine dip Pt symptoms treated with headache cocktail including LR x 1000 ml, Reglan 10 mg IV, Decadron 10 mg IV, and Benadryl 25 mg IV Pt initially reported her h/a resolved but abdominal pain continued, but after another hour,  all pt pain and  dizziness resolved Consult Dr Greta Doom with presentation, exam findings and test results.  D/C home with close outpatient follow up Pt discharge with strict return precautions.   Assessment: 1. Headache in pregnancy, antepartum, third trimester   2. Gestational hypertension without significant proteinuria in third trimester   3. Dizziness   4. Abdominal pain affecting pregnancy     Plan: Discharge home Labor precautions and fetal kick counts Follow-up Information    [facility]. Call.   Why:  Make appointment for blood pressure check on Monday or Tuesday. Return to MAU as needed for emergencies. Contact information: [address]  [address] [place] [address] [phone]          Allergies as of 07/19/2018   No Known Allergies     Medication List    TAKE these medications   labetalol 200 MG tablet Commonly known as:  NORMODYNE Take 1 tablet (200 mg total) by mouth 2 (two) times daily.   multivitamin-prenatal 27-0.8 MG Tabs tablet Take 1 tablet by mouth daily at 12 noon.       Sharen Counter Certified Nurse-Midwife 07/19/2018 3:47 AM

## 2018-07-18 NOTE — MAU Note (Signed)
Pt having upper abdominal pain and nausea for 2 hours. Also dizzy now. Has vomited once. Has daily headaches. Denies LOF or bleeding. +FM Breech

## 2018-07-18 NOTE — Progress Notes (Signed)
FHT from 9-24 reviewed.  Reactive NST with some tiny variable decels

## 2018-07-19 LAB — CBC
HCT: 31.3 % — ABNORMAL LOW (ref 36.0–46.0)
Hemoglobin: 10 g/dL — ABNORMAL LOW (ref 12.0–15.0)
MCH: 29.2 pg (ref 26.0–34.0)
MCHC: 31.9 g/dL (ref 30.0–36.0)
MCV: 91.3 fL (ref 78.0–100.0)
PLATELETS: 164 10*3/uL (ref 150–400)
RBC: 3.43 MIL/uL — ABNORMAL LOW (ref 3.87–5.11)
RDW: 15 % (ref 11.5–15.5)
WBC: 12.9 10*3/uL — ABNORMAL HIGH (ref 4.0–10.5)

## 2018-07-19 LAB — PROTEIN / CREATININE RATIO, URINE
Creatinine, Urine: 82 mg/dL
Protein Creatinine Ratio: 0.2 mg/mg{Cre} — ABNORMAL HIGH (ref 0.00–0.15)
Total Protein, Urine: 16 mg/dL

## 2018-07-19 LAB — COMPREHENSIVE METABOLIC PANEL
ALT: 20 U/L (ref 0–44)
AST: 41 U/L (ref 15–41)
Albumin: 2.8 g/dL — ABNORMAL LOW (ref 3.5–5.0)
Alkaline Phosphatase: 137 U/L — ABNORMAL HIGH (ref 38–126)
Anion gap: 8 (ref 5–15)
BUN: 10 mg/dL (ref 6–20)
CHLORIDE: 110 mmol/L (ref 98–111)
CO2: 21 mmol/L — AB (ref 22–32)
CREATININE: 0.57 mg/dL (ref 0.44–1.00)
Calcium: 8.5 mg/dL — ABNORMAL LOW (ref 8.9–10.3)
GFR calc Af Amer: >60 mL/min (ref >60)
GFR calc non Af Amer: >60 mL/min (ref >60)
GLUCOSE: 97 mg/dL (ref 70–99)
POTASSIUM: 4.3 mmol/L (ref 3.5–5.1)
SODIUM: 139 mmol/L (ref 135–145)
Total Bilirubin: 1 mg/dL (ref 0.3–1.2)
Total Protein: 5.7 g/dL — ABNORMAL LOW (ref 6.5–8.1)

## 2018-07-20 ENCOUNTER — Inpatient Hospital Stay (HOSPITAL_COMMUNITY)
Admission: AD | Admit: 2018-07-20 | Discharge: 2018-07-25 | DRG: 788 | Disposition: A | Discharge status: Home / Self Care | Payer: 59 | Attending: Obstetrics and Gynecology | Admitting: Obstetrics and Gynecology

## 2018-07-20 ENCOUNTER — Other Ambulatory Visit: Payer: Self-pay

## 2018-07-20 ENCOUNTER — Inpatient Hospital Stay (HOSPITAL_COMMUNITY): Payer: 59

## 2018-07-20 ENCOUNTER — Encounter (HOSPITAL_COMMUNITY): Payer: Self-pay

## 2018-07-20 DIAGNOSIS — O1205 Gestational edema, complicating the puerperium: Secondary | ICD-10-CM | POA: Diagnosis present

## 2018-07-20 DIAGNOSIS — O321XX0 Maternal care for breech presentation, not applicable or unspecified: Secondary | ICD-10-CM | POA: Diagnosis present

## 2018-07-20 DIAGNOSIS — Z98891 History of uterine scar from previous surgery: Secondary | ICD-10-CM

## 2018-07-20 DIAGNOSIS — O9989 Other specified diseases and conditions complicating pregnancy, childbirth and the puerperium: Secondary | ICD-10-CM

## 2018-07-20 DIAGNOSIS — R51 Headache: Secondary | ICD-10-CM

## 2018-07-20 DIAGNOSIS — O1424 HELLP syndrome, complicating childbirth: Principal | ICD-10-CM | POA: Diagnosis present

## 2018-07-20 DIAGNOSIS — O99891 Other specified diseases and conditions complicating pregnancy: Secondary | ICD-10-CM

## 2018-07-20 DIAGNOSIS — O26893 Other specified pregnancy related conditions, third trimester: Secondary | ICD-10-CM

## 2018-07-20 DIAGNOSIS — R109 Unspecified abdominal pain: Secondary | ICD-10-CM

## 2018-07-20 DIAGNOSIS — O133 Gestational [pregnancy-induced] hypertension without significant proteinuria, third trimester: Secondary | ICD-10-CM | POA: Diagnosis present

## 2018-07-20 DIAGNOSIS — Z3A34 34 weeks gestation of pregnancy: Secondary | ICD-10-CM

## 2018-07-20 DIAGNOSIS — R0602 Shortness of breath: Secondary | ICD-10-CM

## 2018-07-20 LAB — CBC
HCT: 30.8 % — ABNORMAL LOW (ref 36.0–46.0)
HEMOGLOBIN: 9.8 g/dL — AB (ref 12.0–15.0)
MCH: 28.4 pg (ref 26.0–34.0)
MCHC: 31.8 g/dL (ref 30.0–36.0)
MCV: 89.3 fL (ref 78.0–100.0)
PLATELETS: 164 10*3/uL (ref 150–400)
RBC: 3.45 MIL/uL — AB (ref 3.87–5.11)
RDW: 14.8 % (ref 11.5–15.5)
WBC: 13.1 10*3/uL — AB (ref 4.0–10.5)

## 2018-07-20 LAB — TYPE AND SCREEN
ABO/RH(D): O POS
ANTIBODY SCREEN: NEGATIVE

## 2018-07-20 LAB — COMPREHENSIVE METABOLIC PANEL
ALK PHOS: 140 U/L — AB (ref 38–126)
ALT: 37 U/L (ref 0–44)
AST: 44 U/L — AB (ref 15–41)
Albumin: 2.5 g/dL — ABNORMAL LOW (ref 3.5–5.0)
Anion gap: 7 (ref 5–15)
BUN: 9 mg/dL (ref 6–20)
CALCIUM: 8.3 mg/dL — AB (ref 8.9–10.3)
CHLORIDE: 111 mmol/L (ref 98–111)
CO2: 22 mmol/L (ref 22–32)
CREATININE: 0.7 mg/dL (ref 0.44–1.00)
GFR calc non Af Amer: >60 mL/min (ref >60)
Glucose, Bld: 72 mg/dL (ref 70–99)
Potassium: 3.7 mmol/L (ref 3.5–5.1)
SODIUM: 140 mmol/L (ref 135–145)
Total Bilirubin: 0.7 mg/dL (ref 0.3–1.2)
Total Protein: 5.5 g/dL — ABNORMAL LOW (ref 6.5–8.1)

## 2018-07-20 LAB — PROTEIN / CREATININE RATIO, URINE
Creatinine, Urine: 57 mg/dL
Protein Creatinine Ratio: 0.16 mg/mg{Cre} — ABNORMAL HIGH (ref 0.00–0.15)
Total Protein, Urine: 9 mg/dL

## 2018-07-20 LAB — AMNISURE RUPTURE OF MEMBRANE (ROM) NOT AT ARMC: Amnisure ROM: NEGATIVE

## 2018-07-20 MED ORDER — PRENATAL MULTIVITAMIN CH: 1.0000 | ORAL_TABLET | Freq: Every day | ORAL | Status: DC

## 2018-07-20 MED ORDER — CALCIUM CARBONATE ANTACID 500 MG PO CHEW: 2.0000 | CHEWABLE_TABLET | ORAL | Status: DC | PRN

## 2018-07-20 MED ORDER — IPRATROPIUM-ALBUTEROL 0.5-2.5 (3) MG/3ML IN SOLN
3.0000 mL | Freq: Four times a day (QID) | RESPIRATORY_TRACT | Status: DC
  Administered 2018-07-20: 3 mL via RESPIRATORY_TRACT
  Filled 2018-07-20 (×4): qty 3

## 2018-07-20 MED ORDER — DOCUSATE SODIUM 100 MG PO CAPS
100.0000 mg | ORAL_CAPSULE | Freq: Every day | ORAL | Status: DC
  Administered 2018-07-21: 100 mg via ORAL
  Filled 2018-07-20: qty 1

## 2018-07-20 MED ORDER — ACETAMINOPHEN 325 MG PO TABS: 650.0000 mg | ORAL_TABLET | ORAL | Status: DC | PRN

## 2018-07-20 MED ORDER — METOCLOPRAMIDE HCL 10 MG PO TABS
10.0000 mg | ORAL_TABLET | Freq: Three times a day (TID) | ORAL | Status: DC | PRN
  Administered 2018-07-20 – 2018-07-21 (×2): 10 mg via ORAL
  Filled 2018-07-20 (×2): qty 1

## 2018-07-20 MED ORDER — LABETALOL HCL 200 MG PO TABS
200.0000 mg | ORAL_TABLET | Freq: Two times a day (BID) | ORAL | Status: DC
  Administered 2018-07-20 – 2018-07-21 (×2): 200 mg via ORAL
  Filled 2018-07-20 (×2): qty 1

## 2018-07-20 MED ORDER — BUTALBITAL-APAP-CAFFEINE 50-325-40 MG PO TABS
2.0000 | ORAL_TABLET | Freq: Four times a day (QID) | ORAL | Status: DC | PRN
  Administered 2018-07-20: 2 via ORAL
  Filled 2018-07-20: qty 2

## 2018-07-20 MED ORDER — ZOLPIDEM TARTRATE 5 MG PO TABS: 5.0000 mg | ORAL_TABLET | Freq: Every evening | ORAL | Status: DC | PRN

## 2018-07-20 MED ORDER — LORAZEPAM 1 MG PO TABS
0.5000 mg | ORAL_TABLET | Freq: Once | ORAL | Status: AC
  Administered 2018-07-20: 0.5 mg via ORAL
  Filled 2018-07-20: qty 1

## 2018-07-20 NOTE — MAU Note (Signed)
Pt states she has been SOB since around 0100 this morning.  Pt also reports ruq pain, headache, and blurry vision.  Pt. Reports a small amount of fluid leaking this morning. Pt denies vag. Bleeding or dc.  Reports good fm

## 2018-07-20 NOTE — MAU Provider Note (Addendum)
History     CSN: [id number]  Arrival date and time: 07/20/18 1521  Chief Complaint  Patient presents with  . Abdominal Pain  . Headache  . Dizziness  . Shortness of Breath   G2P0010 @34 .4 wks here with SOB and HA. SOB started last night. Denies CP, chest tightness, wheezing, cough, sore throat, or congestion. She has to sit up to make it better. HA is frontal. Rates 6/10. Has not taken anything for it. Denies visual disturbances. Endorses RUQ pain, this has been ongoing x2 days. Describes pain as sharp and rates 6/10. She also reports leaking clear fluid this morning once. No leaking since. No recent IC. Denies VB or ctx. Feeling good FM. She was seen 2 days ago for HA, RUQ pain and N/V and was found to have GHTN.  OB History    Gravida  2   Para      Term      Preterm      AB  1   Living        SAB  1   TAB      Ectopic      Multiple      Live Births              Past Medical History:  Diagnosis Date  . Asthma   . Diabetes mellitus without complication (HCC)    GDM  . Hypertension    GHTN    Past Surgical History:  Procedure Laterality Date  . TONSILLECTOMY AND ADENOIDECTOMY      History reviewed. No pertinent family history.  Social History   Tobacco Use  . Smoking status: Never Smoker  . Smokeless tobacco: Never Used  Substance Use Topics  . Alcohol use: Never    Frequency: Never  . Drug use: Never    Allergies: No Known Allergies  Medications Prior to Admission  Medication Sig Dispense Refill Last Dose  . labetalol (NORMODYNE) 200 MG tablet Take 1 tablet (200 mg total) by mouth 2 (two) times daily. 60 tablet 1 07/20/2018 at Unknown time  . Prenatal Vit-Fe Fumarate-FA (MULTIVITAMIN-PRENATAL) 27-0.8 MG TABS tablet Take 1 tablet by mouth daily at 12 noon.   07/20/2018 at Unknown time    Review of Systems  HENT: Negative for congestion and sore throat.   Respiratory: Positive for shortness of breath. Negative for cough, chest tightness  and wheezing.   Cardiovascular: Positive for leg swelling. Negative for chest pain.  Gastrointestinal: Positive for abdominal pain.  Genitourinary: Positive for vaginal discharge.   Physical Exam   Blood pressure (!) 148/93, pulse 78, temperature 98.1 F (36.7 C), temperature source Oral, resp. rate (!) 26, height 5\' 1"  (1.549 m), weight 98 kg, SpO2 99 %.  Patient Vitals for the past 24 hrs:  BP Temp Temp src Pulse Resp SpO2 Height Weight  07/20/18 1917 (!) 148/93 - - 78 - - - -  07/20/18 1845 (!) 145/86 - - 74 - 99 % - -  07/20/18 1834 - - - - - 100 % - -  07/20/18 1831 (!) 145/80 - - (!) 58 - - - -  07/20/18 1815 (!) 153/82 - - 60 - 100 % - -  07/20/18 1800 (!) 152/86 - - (!) 57 - 99 % - -  07/20/18 1746 (!) 150/86 - - 67 - - - -  07/20/18 1730 (!) 153/89 - - (!) 59 - 100 % - -  07/20/18 1724 - - - - - 99 % - -  07/20/18 1716 134/75 - - 61 - - - -  07/20/18 1703 (!) 150/83 - - 60 - - - -  07/20/18 1631 135/79 - - (!) 59 - - - -  07/20/18 1615 (!) 152/83 - - 62 - 99 % - -  07/20/18 1606 (!) 128/107 - - 63 - 97 % - -  07/20/18 1548 (!) 153/91 98.1 F (36.7 C) Oral 61 (!) 26 99 % 5\' 1"  (1.549 m) 98 kg  07/20/18 1535 (!) 146/127 - - - - - - -    Physical Exam  Nursing note and vitals reviewed. Constitutional: She is oriented to person, place, and time. She appears well-developed and well-nourished. No distress.  HENT:  Head: Normocephalic and atraumatic.  Neck: Normal range of motion.  Cardiovascular: Normal rate, regular rhythm and normal heart sounds.  Respiratory: Accessory muscle usage present. No respiratory distress. She has decreased breath sounds in the right middle field and the left middle field. She has no wheezes. She has no rales.  Musculoskeletal: Normal range of motion. She exhibits edema (LE 2+).  Neurological: She is alert and oriented to person, place, and time. She displays normal reflexes.  Skin: Skin is warm and dry.  Psychiatric: She has a normal mood and  affect.  EFM: 150 bpm, mod variability, + accels, no decels Toco: rare  Results for orders placed or performed during the hospital encounter of 07/20/18 (from the past 24 hour(s))  Protein / creatinine ratio, urine     Status: Abnormal   Collection Time: 07/20/18  3:43 PM  Result Value Ref Range   Creatinine, Urine 57.00 mg/dL   Total Protein, Urine 9 mg/dL   Protein Creatinine Ratio 0.16 (H) 0.00 - 0.15 mg/mg[Cre]  CBC     Status: Abnormal   Collection Time: 07/20/18  4:29 PM  Result Value Ref Range   WBC 13.1 (H) 4.0 - 10.5 K/uL   RBC 3.45 (L) 3.87 - 5.11 MIL/uL   Hemoglobin 9.8 (L) 12.0 - 15.0 g/dL   HCT 96.0 (L) 45.4 - 09.8 %   MCV 89.3 78.0 - 100.0 fL   MCH 28.4 26.0 - 34.0 pg   MCHC 31.8 30.0 - 36.0 g/dL   RDW 11.9 14.7 - 82.9 %   Platelets 164 150 - 400 K/uL  Comprehensive metabolic panel     Status: Abnormal   Collection Time: 07/20/18  4:29 PM  Result Value Ref Range   Sodium 140 135 - 145 mmol/L   Potassium 3.7 3.5 - 5.1 mmol/L   Chloride 111 98 - 111 mmol/L   CO2 22 22 - 32 mmol/L   Glucose, Bld 72 70 - 99 mg/dL   BUN 9 6 - 20 mg/dL   Creatinine, Ser 5.62 0.44 - 1.00 mg/dL   Calcium 8.3 (L) 8.9 - 10.3 mg/dL   Total Protein 5.5 (L) 6.5 - 8.1 g/dL   Albumin 2.5 (L) 3.5 - 5.0 g/dL   AST 44 (H) 15 - 41 U/L   ALT 37 0 - 44 U/L   Alkaline Phosphatase 140 (H) 38 - 126 U/L   Total Bilirubin 0.7 0.3 - 1.2 mg/dL   GFR calc non Af Amer >60 >60 mL/min   GFR calc Af Amer >60 >60 mL/min   Anion gap 7 5 - 15  Amnisure rupture of membrane (rom)not at [facility]     Status: None   Collection Time: 07/20/18  4:38 PM  Result Value Ref Range   Amnisure ROM NEGATIVE  MAU Course  Procedures Fioricet  MDM Labs and CXR ordered and reviewed. No pulmonary edema, will try neb treatment. Some improvement after neb but still feels SOB, also reports a few episodes of forgetting things since she's been here like her DOB, states "I feel off". Pt appears anxious. HA not improved but worse  after Fioricet and now located occipital. Pt states she saw blood when in the BR, speculum exam shows white discharge, no blood, cervix closed/long. Consult with Dr Meinsinger, recommends dose of Ativan. Transfer of care given to [place], [place], CNM  07/20/2018 8:31 PM   Care of patient assumed by Dorathy Kinsman, CNM at 8 PM.  Ativan given at 7:40 PM.  Will reassess patient symptoms in 1 hour and call Dr. Jackelyn Knife.  Patient reports no improvement in shortness of breath or headache after Ativan.  Discussed with Dr. Jackelyn Knife.  Will admit patient.  Assessment and Plan   1. Shortness of breath   2. Pregnancy headache in third trimester   3. Shortness of breath during pregnancy   4. Gestational hypertension, third trimester    Admit to third floor per Dr. Jackelyn Knife Dr. Jackelyn Knife assuming care of patient.  [place], [place], [place] 07/20/2018 8:41 PM

## 2018-07-20 NOTE — Progress Notes (Signed)
FHT from 9-27 reviewed.  Reactive NST

## 2018-07-21 ENCOUNTER — Inpatient Hospital Stay (HOSPITAL_COMMUNITY): Payer: 59 | Admitting: Anesthesiology

## 2018-07-21 ENCOUNTER — Encounter (HOSPITAL_COMMUNITY): Payer: Self-pay | Admitting: *Deleted

## 2018-07-21 ENCOUNTER — Encounter (HOSPITAL_COMMUNITY)
Admission: AD | Disposition: A | Discharge status: Home / Self Care | Payer: Self-pay | Source: Home / Self Care | Attending: Obstetrics and Gynecology

## 2018-07-21 DIAGNOSIS — R0602 Shortness of breath: Secondary | ICD-10-CM | POA: Diagnosis present

## 2018-07-21 DIAGNOSIS — R109 Unspecified abdominal pain: Secondary | ICD-10-CM | POA: Diagnosis not present

## 2018-07-21 DIAGNOSIS — Z3A34 34 weeks gestation of pregnancy: Secondary | ICD-10-CM | POA: Diagnosis not present

## 2018-07-21 DIAGNOSIS — O1424 HELLP syndrome, complicating childbirth: Secondary | ICD-10-CM | POA: Diagnosis present

## 2018-07-21 DIAGNOSIS — O26893 Other specified pregnancy related conditions, third trimester: Secondary | ICD-10-CM | POA: Diagnosis not present

## 2018-07-21 DIAGNOSIS — O321XX0 Maternal care for breech presentation, not applicable or unspecified: Secondary | ICD-10-CM | POA: Diagnosis present

## 2018-07-21 DIAGNOSIS — Z98891 History of uterine scar from previous surgery: Secondary | ICD-10-CM

## 2018-07-21 DIAGNOSIS — O1205 Gestational edema, complicating the puerperium: Secondary | ICD-10-CM | POA: Diagnosis present

## 2018-07-21 DIAGNOSIS — O9989 Other specified diseases and conditions complicating pregnancy, childbirth and the puerperium: Secondary | ICD-10-CM | POA: Diagnosis not present

## 2018-07-21 LAB — COMPREHENSIVE METABOLIC PANEL
ALBUMIN: 2.3 g/dL — AB (ref 3.5–5.0)
ALK PHOS: 127 U/L — AB (ref 38–126)
ALT: 76 U/L — ABNORMAL HIGH (ref 0–44)
AST: 80 U/L — AB (ref 15–41)
Anion gap: 7 (ref 5–15)
BILIRUBIN TOTAL: 0.7 mg/dL (ref 0.3–1.2)
BUN: 8 mg/dL (ref 6–20)
CHLORIDE: 110 mmol/L (ref 98–111)
CO2: 22 mmol/L (ref 22–32)
Calcium: 8 mg/dL — ABNORMAL LOW (ref 8.9–10.3)
Creatinine, Ser: 0.65 mg/dL (ref 0.44–1.00)
GFR calc Af Amer: >60 mL/min (ref >60)
GLUCOSE: 79 mg/dL (ref 70–99)
POTASSIUM: 3.7 mmol/L (ref 3.5–5.1)
Sodium: 139 mmol/L (ref 135–145)
Total Protein: 5.2 g/dL — ABNORMAL LOW (ref 6.5–8.1)

## 2018-07-21 LAB — CBC
HEMATOCRIT: 27.2 % — AB (ref 36.0–46.0)
HEMATOCRIT: 27.3 % — AB (ref 36.0–46.0)
HEMOGLOBIN: 8.8 g/dL — AB (ref 12.0–15.0)
HEMOGLOBIN: 8.9 g/dL — AB (ref 12.0–15.0)
MCH: 28.9 pg (ref 26.0–34.0)
MCH: 29.2 pg (ref 26.0–34.0)
MCHC: 32.4 g/dL (ref 30.0–36.0)
MCHC: 32.6 g/dL (ref 30.0–36.0)
MCV: 89.5 fL (ref 78.0–100.0)
MCV: 89.5 fL (ref 78.0–100.0)
Platelets: 139 10*3/uL — ABNORMAL LOW (ref 150–400)
Platelets: 146 10*3/uL — ABNORMAL LOW (ref 150–400)
RBC: 3.04 MIL/uL — ABNORMAL LOW (ref 3.87–5.11)
RBC: 3.05 MIL/uL — ABNORMAL LOW (ref 3.87–5.11)
RDW: 15 % (ref 11.5–15.5)
RDW: 15.1 % (ref 11.5–15.5)
WBC: 8.6 10*3/uL (ref 4.0–10.5)
WBC: 8.7 10*3/uL (ref 4.0–10.5)

## 2018-07-21 LAB — ABO/RH: ABO/RH(D): O POS

## 2018-07-21 LAB — GLUCOSE, CAPILLARY: Glucose-Capillary: 70 mg/dL (ref 70–99)

## 2018-07-21 SURGERY — Surgical Case
Anesthesia: Spinal

## 2018-07-21 MED ORDER — SOD CITRATE-CITRIC ACID 500-334 MG/5ML PO SOLN
30.0000 mL | Freq: Once | ORAL | Status: AC
  Filled 2018-07-21: qty 30

## 2018-07-21 MED ORDER — ACETAMINOPHEN 10 MG/ML IV SOLN
1000.0000 mg | Freq: Once | INTRAVENOUS | Status: DC | PRN
  Administered 2018-07-21: 1000 mg via INTRAVENOUS

## 2018-07-21 MED ORDER — NALBUPHINE HCL 10 MG/ML IJ SOLN
5.0000 mg | INTRAMUSCULAR | Status: AC | PRN
  Administered 2018-07-21 (×2): 5 mg via SUBCUTANEOUS

## 2018-07-21 MED ORDER — OXYCODONE HCL 5 MG PO TABS
10.0000 mg | ORAL_TABLET | Freq: Four times a day (QID) | ORAL | Status: DC | PRN
  Administered 2018-07-21: 10 mg via ORAL
  Filled 2018-07-21: qty 2

## 2018-07-21 MED ORDER — MENTHOL 3 MG MT LOZG: 1.0000 | LOZENGE | OROMUCOSAL | Status: DC | PRN

## 2018-07-21 MED ORDER — OXYTOCIN 40 UNITS IN LACTATED RINGERS INFUSION - SIMPLE MED: 2.5000 [IU]/h | INTRAVENOUS | Status: AC

## 2018-07-21 MED ORDER — ZOLPIDEM TARTRATE 5 MG PO TABS: 5.0000 mg | ORAL_TABLET | Freq: Every evening | ORAL | Status: DC | PRN

## 2018-07-21 MED ORDER — LACTATED RINGERS IV SOLN
INTRAVENOUS | Status: DC
  Administered 2018-07-22: 100 mL/h via INTRAVENOUS
  Administered 2018-07-22: 12:00:00 via INTRAVENOUS

## 2018-07-21 MED ORDER — WITCH HAZEL-GLYCERIN EX PADS: 1.0000 "application " | MEDICATED_PAD | CUTANEOUS | Status: DC | PRN

## 2018-07-21 MED ORDER — PHENYLEPHRINE 8 MG IN D5W 100 ML (0.08MG/ML) PREMIX OPTIME
INJECTION | INTRAVENOUS | Status: AC
  Filled 2018-07-21: qty 100

## 2018-07-21 MED ORDER — METOCLOPRAMIDE HCL 5 MG/ML IJ SOLN
INTRAMUSCULAR | Status: DC | PRN
  Administered 2018-07-21: 10 mg via INTRAVENOUS

## 2018-07-21 MED ORDER — MAGNESIUM SULFATE 40 G IN LACTATED RINGERS - SIMPLE
2.0000 g/h | INTRAVENOUS | Status: AC
  Administered 2018-07-21: 4 g/h via INTRAVENOUS
  Administered 2018-07-22: 2 g/h via INTRAVENOUS
  Filled 2018-07-21: qty 500

## 2018-07-21 MED ORDER — MEPERIDINE HCL 25 MG/ML IJ SOLN
INTRAMUSCULAR | Status: DC | PRN
  Administered 2018-07-21 (×2): 12.5 mg via INTRAVENOUS

## 2018-07-21 MED ORDER — PHENYLEPHRINE 40 MCG/ML (10ML) SYRINGE FOR IV PUSH (FOR BLOOD PRESSURE SUPPORT)
PREFILLED_SYRINGE | INTRAVENOUS | Status: AC
  Filled 2018-07-21: qty 10

## 2018-07-21 MED ORDER — ONDANSETRON HCL 4 MG/2ML IJ SOLN
INTRAMUSCULAR | Status: DC | PRN
  Administered 2018-07-21: 4 mg via INTRAVENOUS

## 2018-07-21 MED ORDER — ACETAMINOPHEN 10 MG/ML IV SOLN
INTRAVENOUS | Status: AC
  Filled 2018-07-21: qty 100

## 2018-07-21 MED ORDER — SIMETHICONE 80 MG PO CHEW: 80.0000 mg | CHEWABLE_TABLET | ORAL | Status: DC | PRN

## 2018-07-21 MED ORDER — SCOPOLAMINE 1 MG/3DAYS TD PT72
MEDICATED_PATCH | TRANSDERMAL | Status: DC | PRN
  Administered 2018-07-21: 1 via TRANSDERMAL

## 2018-07-21 MED ORDER — MORPHINE SULFATE (PF) 0.5 MG/ML IJ SOLN
INTRAMUSCULAR | Status: AC
  Filled 2018-07-21: qty 10

## 2018-07-21 MED ORDER — MEPERIDINE HCL 25 MG/ML IJ SOLN
INTRAMUSCULAR | Status: AC
  Filled 2018-07-21: qty 1

## 2018-07-21 MED ORDER — FENTANYL CITRATE (PF) 100 MCG/2ML IJ SOLN
INTRAMUSCULAR | Status: AC
  Filled 2018-07-21: qty 2

## 2018-07-21 MED ORDER — ACETAMINOPHEN 325 MG PO TABS
650.0000 mg | ORAL_TABLET | ORAL | Status: DC | PRN
  Administered 2018-07-22: 650 mg via ORAL
  Filled 2018-07-21: qty 2

## 2018-07-21 MED ORDER — IBUPROFEN 600 MG PO TABS
600.0000 mg | ORAL_TABLET | Freq: Four times a day (QID) | ORAL | Status: DC
  Administered 2018-07-22 – 2018-07-25 (×14): 600 mg via ORAL
  Filled 2018-07-21 (×14): qty 1

## 2018-07-21 MED ORDER — PHENYLEPHRINE 8 MG IN D5W 100 ML (0.08MG/ML) PREMIX OPTIME
INJECTION | INTRAVENOUS | Status: DC | PRN
  Administered 2018-07-21: 10 ug/min via INTRAVENOUS

## 2018-07-21 MED ORDER — MAGNESIUM SULFATE 40 G IN LACTATED RINGERS - SIMPLE
INTRAVENOUS | Status: AC
  Filled 2018-07-21: qty 500

## 2018-07-21 MED ORDER — PRENATAL MULTIVITAMIN CH
1.0000 | ORAL_TABLET | Freq: Every day | ORAL | Status: DC
  Administered 2018-07-22 – 2018-07-24 (×3): 1 via ORAL
  Filled 2018-07-21 (×3): qty 1

## 2018-07-21 MED ORDER — OXYCODONE HCL 5 MG PO TABS
10.0000 mg | ORAL_TABLET | ORAL | Status: DC | PRN
  Administered 2018-07-22 – 2018-07-24 (×5): 10 mg via ORAL
  Filled 2018-07-21 (×4): qty 2

## 2018-07-21 MED ORDER — SENNOSIDES-DOCUSATE SODIUM 8.6-50 MG PO TABS
2.0000 | ORAL_TABLET | ORAL | Status: DC
  Administered 2018-07-22 – 2018-07-24 (×3): 2 via ORAL
  Filled 2018-07-21 (×3): qty 2

## 2018-07-21 MED ORDER — DIBUCAINE 1 % RE OINT: 1.0000 "application " | TOPICAL_OINTMENT | RECTAL | Status: DC | PRN

## 2018-07-21 MED ORDER — TETANUS-DIPHTH-ACELL PERTUSSIS 5-2.5-18.5 LF-MCG/0.5 IM SUSP: 0.5000 mL | Freq: Once | INTRAMUSCULAR | Status: DC

## 2018-07-21 MED ORDER — DIPHENHYDRAMINE HCL 25 MG PO CAPS: 25.0000 mg | ORAL_CAPSULE | Freq: Four times a day (QID) | ORAL | Status: DC | PRN

## 2018-07-21 MED ORDER — SODIUM CHLORIDE 0.9 % IR SOLN
Status: DC | PRN
  Administered 2018-07-21: 1

## 2018-07-21 MED ORDER — FUROSEMIDE 10 MG/ML IJ SOLN
40.0000 mg | Freq: Once | INTRAMUSCULAR | Status: AC
  Administered 2018-07-21: 40 mg via INTRAVENOUS
  Filled 2018-07-21: qty 4

## 2018-07-21 MED ORDER — COCONUT OIL OIL: 1.0000 "application " | TOPICAL_OIL | Status: DC | PRN

## 2018-07-21 MED ORDER — CEFAZOLIN SODIUM-DEXTROSE 2-4 GM/100ML-% IV SOLN
2.0000 g | Freq: Once | INTRAVENOUS | Status: AC
  Administered 2018-07-21: 2 g via INTRAVENOUS
  Filled 2018-07-21: qty 100

## 2018-07-21 MED ORDER — LABETALOL HCL 100 MG PO TABS
100.0000 mg | ORAL_TABLET | Freq: Two times a day (BID) | ORAL | Status: DC
  Administered 2018-07-21 – 2018-07-22 (×2): 100 mg via ORAL
  Filled 2018-07-21 (×2): qty 1

## 2018-07-21 MED ORDER — MAGNESIUM SULFATE BOLUS VIA INFUSION: 4.0000 g | Freq: Once | INTRAVENOUS | Status: DC

## 2018-07-21 MED ORDER — DEXAMETHASONE SODIUM PHOSPHATE 4 MG/ML IJ SOLN
INTRAMUSCULAR | Status: AC
  Filled 2018-07-21: qty 1

## 2018-07-21 MED ORDER — BUPIVACAINE IN DEXTROSE 0.75-8.25 % IT SOLN
INTRATHECAL | Status: DC | PRN
  Administered 2018-07-21: 1.6 mL via INTRATHECAL

## 2018-07-21 MED ORDER — OXYTOCIN 10 UNIT/ML IJ SOLN
INTRAMUSCULAR | Status: AC
  Filled 2018-07-21: qty 4

## 2018-07-21 MED ORDER — OXYCODONE HCL 5 MG PO TABS: 10.0000 mg | ORAL_TABLET | Freq: Once | ORAL | Status: DC

## 2018-07-21 MED ORDER — LABETALOL HCL 5 MG/ML IV SOLN: 20.0000 mg | INTRAVENOUS | Status: DC | PRN

## 2018-07-21 MED ORDER — OXYTOCIN 10 UNIT/ML IJ SOLN
INTRAVENOUS | Status: DC | PRN
  Administered 2018-07-21: 40 [IU] via INTRAVENOUS

## 2018-07-21 MED ORDER — MORPHINE SULFATE (PF) 0.5 MG/ML IJ SOLN
INTRAMUSCULAR | Status: DC | PRN
  Administered 2018-07-21: 150 ug via INTRATHECAL

## 2018-07-21 MED ORDER — LABETALOL HCL 5 MG/ML IV SOLN: 40.0000 mg | INTRAVENOUS | Status: DC | PRN

## 2018-07-21 MED ORDER — MAGNESIUM SULFATE 4 GM/100ML IV SOLN
4.0000 g | Freq: Once | INTRAVENOUS | Status: DC
  Filled 2018-07-21 (×3): qty 100

## 2018-07-21 MED ORDER — ONDANSETRON HCL 4 MG/2ML IJ SOLN
INTRAMUSCULAR | Status: AC
  Filled 2018-07-21: qty 2

## 2018-07-21 MED ORDER — SOD CITRATE-CITRIC ACID 500-334 MG/5ML PO SOLN
ORAL | Status: AC
  Administered 2018-07-21: 13:00:00
  Filled 2018-07-21: qty 15

## 2018-07-21 MED ORDER — LACTATED RINGERS IV SOLN
INTRAVENOUS | Status: DC | PRN
  Administered 2018-07-21: 14:00:00 via INTRAVENOUS

## 2018-07-21 MED ORDER — LACTATED RINGERS IV SOLN
INTRAVENOUS | Status: DC | PRN
  Administered 2018-07-21: 12:00:00 via INTRAVENOUS

## 2018-07-21 MED ORDER — OXYCODONE HCL 5 MG PO TABS
5.0000 mg | ORAL_TABLET | ORAL | Status: DC | PRN
  Administered 2018-07-22 – 2018-07-24 (×6): 5 mg via ORAL
  Filled 2018-07-21 (×8): qty 1

## 2018-07-21 MED ORDER — FENTANYL CITRATE (PF) 100 MCG/2ML IJ SOLN
INTRAMUSCULAR | Status: DC | PRN
  Administered 2018-07-21: 15 ug via INTRATHECAL

## 2018-07-21 MED ORDER — SIMETHICONE 80 MG PO CHEW
80.0000 mg | CHEWABLE_TABLET | ORAL | Status: DC
  Administered 2018-07-22 – 2018-07-24 (×3): 80 mg via ORAL
  Filled 2018-07-21 (×3): qty 1

## 2018-07-21 MED ORDER — SIMETHICONE 80 MG PO CHEW
80.0000 mg | CHEWABLE_TABLET | Freq: Three times a day (TID) | ORAL | Status: DC
  Administered 2018-07-22 – 2018-07-25 (×8): 80 mg via ORAL
  Filled 2018-07-21 (×9): qty 1

## 2018-07-21 MED ORDER — NALBUPHINE HCL 10 MG/ML IJ SOLN
INTRAMUSCULAR | Status: AC
  Filled 2018-07-21: qty 1

## 2018-07-21 MED ORDER — MAGNESIUM SULFATE 40 G IN LACTATED RINGERS - SIMPLE: 2.0000 g/h | INTRAVENOUS | Status: DC

## 2018-07-21 MED ORDER — HYDRALAZINE HCL 20 MG/ML IJ SOLN: 10.0000 mg | INTRAMUSCULAR | Status: DC | PRN

## 2018-07-21 MED ORDER — LABETALOL HCL 5 MG/ML IV SOLN: 80.0000 mg | INTRAVENOUS | Status: DC | PRN

## 2018-07-21 SURGICAL SUPPLY — 36 items
BENZOIN TINCTURE PRP APPL 2/3 (GAUZE/BANDAGES/DRESSINGS) ×3 IMPLANT
CHLORAPREP W/TINT 26ML (MISCELLANEOUS) ×3 IMPLANT
CLAMP CORD UMBIL (MISCELLANEOUS) IMPLANT
CLOSURE STERI STRIP 1/2 X4 (GAUZE/BANDAGES/DRESSINGS) ×3 IMPLANT
CLOSURE WOUND 1/2 X4 (GAUZE/BANDAGES/DRESSINGS)
CLOTH BEACON ORANGE TIMEOUT ST (SAFETY) ×3 IMPLANT
DRSG OPSITE POSTOP 4X10 (GAUZE/BANDAGES/DRESSINGS) ×3 IMPLANT
ELECT REM PT RETURN 9FT ADLT (ELECTROSURGICAL) ×3
ELECTRODE REM PT RTRN 9FT ADLT (ELECTROSURGICAL) ×1 IMPLANT
EXTRACTOR VACUUM KIWI (MISCELLANEOUS) IMPLANT
GLOVE BIO SURGEON STRL SZ 6.5 (GLOVE) ×2 IMPLANT
GLOVE BIO SURGEONS STRL SZ 6.5 (GLOVE) ×1
GLOVE BIOGEL PI IND STRL 7.0 (GLOVE) ×1 IMPLANT
GLOVE BIOGEL PI INDICATOR 7.0 (GLOVE) ×2
GOWN STRL REUS W/TWL LRG LVL3 (GOWN DISPOSABLE) ×6 IMPLANT
KIT ABG SYR 3ML LUER SLIP (SYRINGE) IMPLANT
NEEDLE HYPO 25X5/8 SAFETYGLIDE (NEEDLE) IMPLANT
NS IRRIG 1000ML POUR BTL (IV SOLUTION) ×3 IMPLANT
PACK C SECTION WH (CUSTOM PROCEDURE TRAY) ×3 IMPLANT
PAD OB MATERNITY 4.3X12.25 (PERSONAL CARE ITEMS) ×3 IMPLANT
PENCIL SMOKE EVAC W/HOLSTER (ELECTROSURGICAL) ×3 IMPLANT
RTRCTR C-SECT PINK 25CM LRG (MISCELLANEOUS) ×3 IMPLANT
SPONGE LAP 18X18 RF (DISPOSABLE) ×9 IMPLANT
STRIP CLOSURE SKIN 1/2X4 (GAUZE/BANDAGES/DRESSINGS) IMPLANT
SUT CHROMIC 1 CTX 36 (SUTURE) ×6 IMPLANT
SUT PLAIN 0 NONE (SUTURE) IMPLANT
SUT PLAIN 2 0 XLH (SUTURE) ×3 IMPLANT
SUT VIC AB 0 CT1 27 (SUTURE) ×4
SUT VIC AB 0 CT1 27XBRD ANBCTR (SUTURE) ×2 IMPLANT
SUT VIC AB 2-0 CT1 27 (SUTURE) ×2
SUT VIC AB 2-0 CT1 TAPERPNT 27 (SUTURE) ×1 IMPLANT
SUT VIC AB 3-0 CT1 27 (SUTURE)
SUT VIC AB 3-0 CT1 TAPERPNT 27 (SUTURE) IMPLANT
SUT VIC AB 4-0 KS 27 (SUTURE) ×3 IMPLANT
TOWEL OR 17X24 6PK STRL BLUE (TOWEL DISPOSABLE) ×3 IMPLANT
TRAY FOLEY W/BAG SLVR 14FR LF (SET/KITS/TRAYS/PACK) ×3 IMPLANT

## 2018-07-21 NOTE — Anesthesia Procedure Notes (Signed)
Spinal  Patient location during procedure: OR Start time: 07/21/2018 1:16 PM End time: 07/21/2018 1:26 PM Staffing Anesthesiologist: Elmer Picker, MD Performed: anesthesiologist  Preanesthetic Checklist Completed: patient identified, surgical consent, pre-op evaluation, timeout performed, IV checked, risks and benefits discussed and monitors and equipment checked Spinal Block Patient position: sitting Prep: site prepped and draped and DuraPrep Patient monitoring: cardiac monitor, continuous pulse ox and blood pressure Approach: midline Location: L3-4 Injection technique: single-shot Needle Needle type: Pencan  Needle gauge: 24 G Needle length: 9 cm Assessment Sensory level: T6 Additional Notes Functioning IV was confirmed and monitors were applied. Sterile prep and drape, including hand hygiene and sterile gloves were used. The patient was positioned and the spine was prepped. The skin was anesthetized with lidocaine.  Free flow of clear CSF was obtained prior to injecting local anesthetic into the CSF.  The spinal needle aspirated freely following injection.  The needle was carefully withdrawn.  The patient tolerated the procedure well.

## 2018-07-21 NOTE — Transfer of Care (Signed)
Immediate Anesthesia Transfer of Care Note  Patient: Paige Moreno  Procedure(s) Performed: CESAREAN SECTION (N/A )  Patient Location: PACU  Anesthesia Type:Spinal  Level of Consciousness: awake, alert  and oriented  Airway & Oxygen Therapy: Patient Spontanous Breathing  Post-op Assessment: Report given to RN and Post -op Vital signs reviewed and stable  Post vital signs: Reviewed and stable  Last Vitals:  Vitals Value Taken Time  BP 140/89 07/21/2018  2:33 PM  Temp    Pulse 63 07/21/2018  2:34 PM  Resp    SpO2 99 % 07/21/2018  2:34 PM  Vitals shown include unvalidated device data.  Last Pain:  Vitals:   07/21/18 0941  TempSrc:   PainSc: 5       Patients Stated Pain Goal: 4 (07/21/18 0941)  Complications: No apparent anesthesia complications

## 2018-07-21 NOTE — Op Note (Addendum)
Operative Note    Preoperative Diagnosis Preterm pregnancy at 34 5/7 weeks Severe preeclampsia with HELLP syndrome Breech presentation  Postoperative Diagnosis same  Procedure Primary low transverse c-section with two layer closure of uterus  Surgeon Huel Cote, MD Genice Rouge, RNFA  Anesthesia Spinal  Fluids: EBL UOP 75mL clear IVF LR  Findings A viable female infant in the complete breech presentation Apgars 8,8 Nml uterus tubes and ovaries  Specimen Placenta to L&D  Procedure Note  Patient was taken to the operating room where epidural anesthesia was found to be adequate by Allis clamp test. She was prepped and draped in the normal sterile fashion in the dorsal supine position with a leftward tilt. An appropriate time out was performed. A Pfannenstiel skin incision was then made with the scalpel and carried through to the underlying layer of fascia by sharp dissection and Bovie cautery. The fascia was nicked in the midline and the incision was extended laterally with Mayo scissors. The inferior aspect of the incision was grasped Coker clamps and dissected off the underlying rectus muscles. In a similar fashion the superior aspect was dissected off the rectus muscles. Rectus muscles were separated in the midline and the peritoneal cavity entered bluntly. The peritoneal incision was then extended both superiorly and inferiorly with careful attention to avoid both bowel and bladder. The Alexis self-retaining wound retractor was then placed within the incision and the lower uterine segment exposed. The bladder flap was developed with Metzenbaum scissors and pushed away from the lower uterine segment. The lower uterine segment was then incised in a transverse fashion and the cavity itself entered bluntly. The incision was extended bluntly. The infant's bottom was then lifted and delivered from the incision without difficulty. The remainder of the infant delivered  and the nose and mouth bulb suctioned with the cord clamped and cut as well. The infant was handed off to the waiting pediatricians. The placenta was then spontaneously expressed from the uterus and the uterus cleared of all clots and debris with moist lap sponge. The uterine incision was then repaired in 2 layers the first layer was a running locked layer 1-0 chromic and the second an imbricating layer of the same suture. The tubes and ovaries were inspected and the gutters cleared of all clots and debris. The uterine incision was inspected and found to be hemostatic. All instruments and sponges as well as the Alexis retractor were then removed from the abdomen. The rectus muscles and peritoneum were then reapproximated with several interrupted mattress sutures of 2-0 Vicryl. The fascia was then closed with 0 Vicryl in a running fashion. Subcutaneous tissue was reapproximated with 3-0 plain in a running fashion. The skin was closed with a subcuticular stitch of 4-0 Vicryl on a Keith needle and then reinforced with benzoin and Steri-Strips. At the conclusion of the procedure all instruments and sponge counts were correct. Patient was taken to the recovery room in good condition and the baby went to NICU.

## 2018-07-21 NOTE — Progress Notes (Signed)
Orders to start magnesium therapy in PACU.

## 2018-07-21 NOTE — Progress Notes (Signed)
Patient ID: Paige Moreno, female   DOB: 04-10-1986, 32 y.o.   MRN: [id number] Pt reports feeling MUCH better,  No HA and no epigastric pain BP 142-165/85-100  Abdomen soft, NT  Pt on magnesium UOP just adequate, not diuresing yet Lasix x 1 Restart labetalol 100mg  po BID Baby stable on RA in NICU.

## 2018-07-21 NOTE — Anesthesia Postprocedure Evaluation (Signed)
Anesthesia Post Note  Patient: Paige Moreno  Procedure(s) Performed: CESAREAN SECTION (N/A )     Patient location during evaluation: [facility] Anesthesia Type: Spinal Level of consciousness: awake and alert Pain management: pain level controlled Vital Signs Assessment: post-procedure vital signs reviewed and stable Respiratory status: spontaneous breathing, nonlabored ventilation and respiratory function stable Cardiovascular status: stable Postop Assessment: no headache, no backache, spinal receding, able to ambulate, adequate PO intake, no apparent nausea or vomiting and patient able to bend at knees Anesthetic complications: no    Last Vitals:  Vitals:   07/21/18 1612 07/21/18 1711  BP: (!) 145/83 (!) 142/88  Pulse: (!) 51 63  Resp: 15   Temp:    SpO2: 98%     Last Pain:  Vitals:   07/21/18 1711  TempSrc:   PainSc: 5    Pain Goal: Patients Stated Pain Goal: 3 (07/21/18 1711)               Donnalee Curry Hristova

## 2018-07-21 NOTE — H&P (Signed)
Chief Complaint  Patient presents with  . Abdominal Pain  . Headache  . Dizziness  . Shortness of Breath   G2P0010 @34 .4 wks here with SOB and HA. SOB started last night. Denies CP, chest tightness, wheezing, cough, sore throat, or congestion. She has to sit up to make it better. HA is frontal. Rates 6/10. Has not taken anything for it. Denies visual disturbances. Endorses RUQ pain, this has been ongoing x2 days. Describes pain as sharp and rates 6/10. She also reports leaking clear fluid this morning once. No leaking since. No recent IC. Denies VB or ctx. Feeling good FM. She was seen 2 days ago for HA, RUQ pain and N/V and was found to have GHTN.          OB History    Gravida  2   Para      Term      Preterm      AB  1   Living        SAB  1   TAB      Ectopic      Multiple      Live Births                  Past Medical History:  Diagnosis Date  . Asthma   . Diabetes mellitus without complication (HCC)    GDM  . Hypertension    GHTN         Past Surgical History:  Procedure Laterality Date  . TONSILLECTOMY AND ADENOIDECTOMY      History reviewed. No pertinent family history.  Social History        Tobacco Use  . Smoking status: Never Smoker  . Smokeless tobacco: Never Used  Substance Use Topics  . Alcohol use: Never    Frequency: Never  . Drug use: Never    Allergies: No Known Allergies         Medications Prior to Admission  Medication Sig Dispense Refill Last Dose  . labetalol (NORMODYNE) 200 MG tablet Take 1 tablet (200 mg total) by mouth 2 (two) times daily. 60 tablet 1 07/20/2018 at Unknown time  . Prenatal Vit-Fe Fumarate-FA (MULTIVITAMIN-PRENATAL) 27-0.8 MG TABS tablet Take 1 tablet by mouth daily at 12 noon.   07/20/2018 at Unknown time    Review of Systems  HENT: Negative for congestion and sore throat.   Respiratory: Positive for shortness of breath. Negative for cough, chest tightness and  wheezing.   Cardiovascular: Positive for leg swelling. Negative for chest pain.  Gastrointestinal: Positive for abdominal pain.  Genitourinary: Positive for vaginal discharge.   Physical Exam   Blood pressure (!) 148/93, pulse 78, temperature 98.1 F (36.7 C), temperature source Oral, resp. rate (!) 26, height 5\' 1"  (1.549 m), weight 98 kg, SpO2 99 %.  Patient Vitals for the past 24 hrs:  BP Temp Temp src Pulse Resp SpO2 Height Weight  07/20/18 1917 (!) 148/93 - - 78 - - - -  07/20/18 1845 (!) 145/86 - - 74 - 99 % - -  07/20/18 1834 - - - - - 100 % - -  07/20/18 1831 (!) 145/80 - - (!) 58 - - - -  07/20/18 1815 (!) 153/82 - - 60 - 100 % - -  07/20/18 1800 (!) 152/86 - - (!) 57 - 99 % - -  07/20/18 1746 (!) 150/86 - - 67 - - - -  07/20/18 1730 (!) 153/89 - - (!) 59 -  100 % - -  07/20/18 1724 - - - - - 99 % - -  07/20/18 1716 134/75 - - 61 - - - -  07/20/18 1703 (!) 150/83 - - 60 - - - -  07/20/18 1631 135/79 - - (!) 59 - - - -  07/20/18 1615 (!) 152/83 - - 62 - 99 % - -  07/20/18 1606 (!) 128/107 - - 63 - 97 % - -  07/20/18 1548 (!) 153/91 98.1 F (36.7 C) Oral 61 (!) 26 99 % 5\' 1"  (1.549 m) 98 kg  07/20/18 1535 (!) 146/127 - - - - - - -    Physical Exam  Nursing note and vitals reviewed. Constitutional: She is oriented to person, place, and time. She appears well-developed and well-nourished. No distress.  HENT:  Head: Normocephalic and atraumatic.  Neck: Normal range of motion.  Cardiovascular: Normal rate, regular rhythm and normal heart sounds.  Respiratory: Accessory muscle usage present. No respiratory distress. She has decreased breath sounds in the right middle field and the left middle field. She has no wheezes. She has no rales.  Musculoskeletal: Normal range of motion. She exhibits edema (LE 2+).  Neurological: She is alert and oriented to person, place, and time. She displays normal reflexes.  Skin: Skin is warm and dry.  Psychiatric: She has a normal mood  and affect.  EFM: 150 bpm, mod variability, + accels, no decels Toco: rare  A/P:  IUP at [redacted]W[redacted]D with PIH, normal labs, SOB and headache.  I do not think her headache is related to elevated BP, it is currently improved after receiving PO Reglan.  BP a bit labile, I have increased her Labetalol to 200 mg bid.  She still has some SOB despite normal O2 sats and essentially normal CXR.  Will try incentive spirometry.  I have admitted her for observation because of frequent visits to MAU and new SOB to make sure I am not missing anything.

## 2018-07-21 NOTE — Progress Notes (Signed)
Patient ID: Paige Moreno, female   DOB: 1986-06-23, 32 y.o.   MRN: [id number] HD #1  Pt reports a headache that is 4/10 this AM, not as severe as yesterday.  Slept overnight.  No severe SOB, just discomfort with deep inhalation.   Good FM  BP 124-148/79-92  Abdomen soft, gravid Breech by Leopold's with head in upper epigastric area LE edema 2+  Patient with gestational hypertension and labetalol 200mg  po BID controlling BP for now HA of unclear etiology but seems improved from yesterday, will try a one time dose of oxycodone if all else stable on labs D/w pt she certainly has gestational hypertension but so far no severe criteria for delivery Breech on last [place] and by Leopold's so will likely need c-section for delivery S/p betamethasone 07/16/18 and 07/17/18 Pt has never really consistently checked her BS at home and RS the GDM class multiple times.  Will check CBG's here and do carb modified diet if labs normal this AM I feel a good portion of patient's MAU visits is anxiety and she agrees.  We will just keep her in-house for now to observe for any severe parameters.

## 2018-07-21 NOTE — Anesthesia Preprocedure Evaluation (Addendum)
Anesthesia Evaluation  Patient identified by MRN, date of birth, ID band Patient awake    Reviewed: Allergy & Precautions, NPO status , Patient's Chart, lab work & pertinent test results  Airway Mallampati: II  TM Distance: >3 FB Neck ROM: Full    Dental no notable dental hx. (+) Teeth Intact, Dental Advisory Given   Pulmonary asthma ,    Pulmonary exam normal breath sounds clear to auscultation       Cardiovascular hypertension (gestational HTN on labetalol), Normal cardiovascular exam Rhythm:Regular Rate:Normal     Neuro/Psych negative neurological ROS  negative psych ROS   GI/Hepatic negative GI ROS, Neg liver ROS,   Endo/Other  negative endocrine ROSdiabetes, Gestational  Renal/GU negative Renal ROS  negative genitourinary   Musculoskeletal negative musculoskeletal ROS (+)   Abdominal   Peds  Hematology negative hematology ROS (+)   Anesthesia Other Findings Presented with gHTN now with elevated liver enzymes. Plt wnl and stable  Reproductive/Obstetrics (+) Pregnancy                            Anesthesia Physical Anesthesia Plan  ASA: III  Anesthesia Plan: Spinal   Post-op Pain Management:    Induction:   PONV Risk Score and Plan: 2 and Treatment may vary due to age or medical condition  Airway Management Planned: Natural Airway  Additional Equipment:   Intra-op Plan:   Post-operative Plan:   Informed Consent: I have reviewed the patients History and Physical, chart, labs and discussed the procedure including the risks, benefits and alternatives for the proposed anesthesia with the patient or authorized representative who has indicated his/her understanding and acceptance.   Dental advisory given  Plan Discussed with: CRNA  Anesthesia Plan Comments:         Anesthesia Quick Evaluation

## 2018-07-21 NOTE — Progress Notes (Signed)
Patient ID: Paige Moreno, female   DOB: 07-11-1986, 32 y.o.   MRN: [id number] 34 5/7 weeks severe preeclampsia  D/w pt that her labs are now becoming abnormal with LFT's doubling to 70-80 range and platelets dropping slightly c/w HELLP  BP stable  140's/90  Bedside US confirms baby still in breech presentation  D/w pt risks and benefits of c-section including bleeding, infection, and possible damage to bowel and bladder.  She is ready to proceed. Plan magnesium to begin in PACU.

## 2018-07-22 LAB — CBC
HCT: 27.8 % — ABNORMAL LOW (ref 36.0–46.0)
Hemoglobin: 9.1 g/dL — ABNORMAL LOW (ref 12.0–15.0)
MCH: 28.8 pg (ref 26.0–34.0)
MCHC: 32.7 g/dL (ref 30.0–36.0)
MCV: 88 fL (ref 78.0–100.0)
Platelets: 158 10*3/uL (ref 150–400)
RBC: 3.16 MIL/uL — AB (ref 3.87–5.11)
RDW: 15 % (ref 11.5–15.5)
WBC: 11.9 10*3/uL — AB (ref 4.0–10.5)

## 2018-07-22 LAB — COMPREHENSIVE METABOLIC PANEL
ALT: 53 U/L — ABNORMAL HIGH (ref 0–44)
ANION GAP: 7 (ref 5–15)
AST: 47 U/L — ABNORMAL HIGH (ref 15–41)
Albumin: 2.2 g/dL — ABNORMAL LOW (ref 3.5–5.0)
Alkaline Phosphatase: 115 U/L (ref 38–126)
BUN: 7 mg/dL (ref 6–20)
CHLORIDE: 103 mmol/L (ref 98–111)
CO2: 24 mmol/L (ref 22–32)
Calcium: 7.2 mg/dL — ABNORMAL LOW (ref 8.9–10.3)
Creatinine, Ser: 0.72 mg/dL (ref 0.44–1.00)
GFR calc non Af Amer: >60 mL/min (ref >60)
GLUCOSE: 90 mg/dL (ref 70–99)
POTASSIUM: 3.9 mmol/L (ref 3.5–5.1)
Sodium: 134 mmol/L — ABNORMAL LOW (ref 135–145)
Total Bilirubin: 0.5 mg/dL (ref 0.3–1.2)
Total Protein: 4.7 g/dL — ABNORMAL LOW (ref 6.5–8.1)

## 2018-07-22 MED ORDER — LABETALOL HCL 100 MG PO TABS
100.0000 mg | ORAL_TABLET | ORAL | Status: AC
  Administered 2018-07-22: 100 mg via ORAL
  Filled 2018-07-22: qty 1

## 2018-07-22 MED ORDER — LABETALOL HCL 200 MG PO TABS
200.0000 mg | ORAL_TABLET | Freq: Two times a day (BID) | ORAL | Status: DC
Start: 2018-07-22 — End: 2018-07-23
  Administered 2018-07-22 – 2018-07-23 (×2): 200 mg via ORAL
  Filled 2018-07-22 (×3): qty 1

## 2018-07-22 NOTE — Progress Notes (Signed)
Patient ID: Paige Moreno, female   DOB: April 12, 1986, 32 y.o.   MRN: [id number] Chart check - Pt BP stable at 140-143/83

## 2018-07-22 NOTE — Plan of Care (Signed)
Pts. Condition will continue to improve 

## 2018-07-22 NOTE — Progress Notes (Signed)
While ambulating patient in halls she reports feeling dizzy, flush, and having ringing in her left ear. Pt assisted in wheelchair and returned to bed. Reports symptoms resolved once in seated position. Encouraged to call for assistance with next attempt to ambulate, verbalized understanding

## 2018-07-22 NOTE — Progress Notes (Signed)
Patient ID: Paige Moreno, female   DOB: 04-28-1986, 32 y.o.   MRN: [id number] Called and informed pt had elevated BP of 153/94 She denies any HA or blurry vision I ordered 100mg  labetalol be given now then increase to 200 mg po bid for subsequent doses.  Recheck BP now and it is 140/84 UOP was ( 7p-7a)                  from 8a- 1400p  On Mgso4 till 1530pm then stop

## 2018-07-22 NOTE — Lactation Note (Signed)
This note was copied from a baby's chart. Lactation Consultation Note  Patient Name: Paige Moreno ZOXWR'U Date: 07/22/2018 Reason for consult: Follow-up assessment;1st time breastfeeding;Primapara;Late-preterm 34-36.6wks;NICU baby  p1 mother whose infant is now 75 hours old.  This is a LPTI at 34+5 weeks weighing >6 lbs  Mother was initially not interested in pumping but has now changed her mind.  She is still unsure if she will ever latch baby to breast or how long she will continue to pump.  However, I explained the importance of starting to pump to increase the likelihood of developing a full milk supply.  When/if she decides this is not her goal we will respect any decision she makes.  The baby is currently receiving donor breast milk in the NICU.    Pump parts, assembly, disassembly and cleaning explained in detail.  Cleaning supplies provided.  Encouraged mother to pump every 3 hours during the day and evening.  She may skip one pumping session during the night if she chooses to rest.  Mother's breasts are soft and non tender and nipples are everted bilaterally.  #24 flange is appropriate at this time.  Mother felt no pain with pumping.    Mother and baby care booklet and NICU booklet provided.  Milk storage times reviewed.  Mother does not participate in the [facility] program and can get a DEBP from her insurance company.  I encouraged her to obtain one soon if she believes she will continue pumping for baby.  Encouraged hand expression before/after pumping and colostrum container provided for any EBM she obtains.  Mother will call for any further questions/concerns she may have.  RN updated.   Maternal Data Formula Feeding for Exclusion: No Has patient been taught Hand Expression?: Yes Does the patient have breastfeeding experience prior to this delivery?: No  Feeding Feeding Type: Formula Nipple Type: Slow - flow Length of feed: 15 min  LATCH Score                    Interventions    Lactation Tools Discussed/Used WIC Program: No Pump Review: Setup, frequency, and cleaning;Milk Storage;Other (comment) Initiated by:: Laureen Ochs Date initiated:: 07/23/18   Consult Status Consult Status: Follow-up Date: 07/23/18 Follow-up type: In-patient    Kyndall Chaplin R Evelynn Hench 07/22/2018, 11:52 AM

## 2018-07-22 NOTE — Progress Notes (Signed)
Patient ID: Paige Moreno, female   DOB: 28-Nov-1985, 32 y.o.   MRN: [id number] Pt was just in NICU with baby. No HA or blurry vision - got lightheaded ambulating this am but better now  BP  elevated at 150-152/98-99 UOP overnight from 1-4am ; pending this am  GEN - NAD ABD - FF, dressing c/d/i EXT - no homans  11.9>9.1<158 LFTs decreasing   A/P: POD#1 s/p ltc/s due to PReE          On MgSO4 till 1630 today         BP elevated - will give am dose now ( an hour early) - if continue to stay elevated will increase dose to 200mg  po bid

## 2018-07-22 NOTE — Progress Notes (Signed)
Patient ID: Paige Moreno, female   DOB: 1986/01/17, 32 y.o.   MRN: [id number] MgSo4 was stopped at 1530 Last BP 140/57 Pt in NICU with baby

## 2018-07-23 MED ORDER — FUROSEMIDE 20 MG PO TABS
20.0000 mg | ORAL_TABLET | Freq: Once | ORAL | Status: AC
  Administered 2018-07-23: 20 mg via ORAL
  Filled 2018-07-23: qty 1

## 2018-07-23 MED ORDER — LABETALOL HCL 200 MG PO TABS
300.0000 mg | ORAL_TABLET | Freq: Two times a day (BID) | ORAL | Status: DC
  Administered 2018-07-23 – 2018-07-25 (×4): 300 mg via ORAL
  Filled 2018-07-23 (×4): qty 1

## 2018-07-23 MED ORDER — LORAZEPAM 1 MG PO TABS
1.0000 mg | ORAL_TABLET | Freq: Every evening | ORAL | Status: DC | PRN
  Administered 2018-07-24: 1 mg via ORAL
  Filled 2018-07-23: qty 1

## 2018-07-23 NOTE — Lactation Note (Signed)
This note was copied from a baby's chart. Lactation Consultation Note  Patient Name: Paige Moreno ZOXWR'U Date: 07/23/2018   Per RN mother wants to formula feed.     Maternal Data    Feeding Feeding Type: Formula Nipple Type: Slow - flow Length of feed: 20 min  LATCH Score                   Interventions    Lactation Tools Discussed/Used     Consult Status      Hardie Pulley 07/23/2018, 11:18 AM

## 2018-07-23 NOTE — Progress Notes (Signed)
Pt called up from NICU for mylicon, labetalol and assessment.

## 2018-07-23 NOTE — Progress Notes (Signed)
POD #2 Doing ok, sore when moving, baby stable in NICU Afeb, VSS, BP 120-140/70-80 Abd- soft, fundus firm, incision intact Continue routine care, continue Labetalol 200 mg bid

## 2018-07-24 ENCOUNTER — Encounter (HOSPITAL_COMMUNITY): Payer: Self-pay

## 2018-07-24 LAB — RPR: RPR: NONREACTIVE

## 2018-07-24 MED ORDER — FUROSEMIDE 40 MG PO TABS
40.0000 mg | ORAL_TABLET | Freq: Three times a day (TID) | ORAL | Status: DC | PRN
  Administered 2018-07-24: 40 mg via ORAL
  Filled 2018-07-24 (×2): qty 1

## 2018-07-24 MED ORDER — NIFEDIPINE ER OSMOTIC RELEASE 30 MG PO TB24
30.0000 mg | ORAL_TABLET | Freq: Every day | ORAL | Status: DC
  Administered 2018-07-24 – 2018-07-25 (×2): 30 mg via ORAL
  Filled 2018-07-24 (×2): qty 1

## 2018-07-24 NOTE — Progress Notes (Signed)
Subjective: Postpartum Day 3: Cesarean Delivery Patient reports incisional pain, tolerating PO and no problems voiding.    Objective: Vital signs in last 24 hours: Temp:  [97.9 F (36.6 C)-98.6 F (37 C)] 97.9 F (36.6 C) (10/03 0749) Pulse Rate:  [57-87] 65 (10/03 0749) Resp:  [16-26] 16 (10/03 0749) BP: (138-161)/(87-111) 153/99 (10/03 0749) SpO2:  [98 %-100 %] 100 % (10/03 0749)  Physical Exam:  General: alert and cooperative Lochia: appropriate Uterine Fundus: firm Incision: C/D/I LE edema up to thighs and sacral  Recent Labs    07/21/18 1222 07/22/18 0528  HGB 8.9* 9.1*  HCT 27.3* 27.8*    Assessment/Plan: Status post Cesarean section. Doing well postoperatively.  She still has pretty significant edema so will give additional lasix today--she is not breastfeeding BP still not optimally controlled on the labetalol BID so will add procardiaXL in the AM.   Paige Moreno 07/24/2018, 10:34 AM

## 2018-07-24 NOTE — Progress Notes (Signed)
Patient ID: Paige Moreno, female   DOB: Apr 30, 1986, 32 y.o.   MRN: [id number] Pt feeling better, states voiding frequently with lasix.  Will continue until edema improves   BP 135/77 at lunch, will see how runs this PM on the added procardia + labetalol  Baby will need to stay in NICU 5-7 more days so pt will likely want d/c home tomorrow if BP stable and can f/u in office in 4 days for BP check

## 2018-07-25 MED ORDER — IBUPROFEN 600 MG PO TABS: 600.0000 mg | ORAL_TABLET | Freq: Four times a day (QID) | ORAL | 1 refills | Status: AC

## 2018-07-25 MED ORDER — OXYCODONE HCL 5 MG PO TABS: 5.0000 mg | ORAL_TABLET | Freq: Four times a day (QID) | ORAL | 0 refills | Status: AC | PRN

## 2018-07-25 MED ORDER — LABETALOL HCL 300 MG PO TABS: 300.0000 mg | ORAL_TABLET | Freq: Two times a day (BID) | ORAL | 1 refills | Status: AC

## 2018-07-25 MED ORDER — NIFEDIPINE ER 30 MG PO TB24: 30.0000 mg | ORAL_TABLET | Freq: Every day | ORAL | 1 refills | Status: AC

## 2018-07-25 MED ORDER — PRENATAL MULTIVITAMIN CH: 1.0000 | ORAL_TABLET | Freq: Every day | ORAL | 3 refills | Status: AC

## 2018-07-25 NOTE — Progress Notes (Signed)
Pt discharged to home with husband.  Condition stable.  Pt ambulated to NICU with plans to leave hospital from there.  No equipment for home ordered at discharge. 

## 2018-07-25 NOTE — Discharge Summary (Signed)
OB Discharge Summary     Patient Name: Paige Moreno DOB: [birth year] MRN: [id number]  Date of admission: 07/20/2018 Delivering MD: Huel Cote   Date of discharge: 07/25/2018  Admitting diagnosis: 30 WKS, DIFFICULTY BREATHING, PAIN, LEAKING Intrauterine pregnancy: Unknown     Secondary diagnosis:  Active Problems:   PIH (pregnancy induced hypertension), third trimester   S/P primary low transverse C-section  Additional problems: N/A     Discharge diagnosis: Preterm Pregnancy Delivered                                                                                                Post partum procedures:N/A (Postpartum edema - treated w lasix)  Augmentation: N/A  Complications: None  Hospital course:  Sceduled C/S   32 y.o. yo G2P0110 at Unknown was admitted to the hospital 07/20/2018 for scheduled cesarean section with the following indication:PIH/Breech.  Membrane Rupture Time/Date: 1:46 PM ,07/21/2018   Patient delivered a Viable infant.07/21/2018  Details of operation can be found in separate operative note.  Pateint had an uncomplicated postpartum course.  She is ambulating, tolerating a regular diet, passing flatus, and urinating well. Patient is discharged home in stable condition on  07/25/18         Physical exam  Vitals:   07/24/18 1937 07/24/18 2311 07/25/18 0630 07/25/18 0748  BP: (!) 152/94 (!) 140/92 133/90 140/90  Pulse: 81 79 67 81  Resp: 16 16 16 16   Temp: 98 F (36.7 C) 98 F (36.7 C) 98.2 F (36.8 C) 98 F (36.7 C)  TempSrc: Oral Oral Oral Oral  SpO2: 99% 99% 99% 99%  Weight:      Height:       General: alert and no distress Lochia: appropriate Uterine Fundus: firm Incision: Healing well with no significant drainage DVT Evaluation: No evidence of DVT seen on physical exam. Labs: Lab Results  Component Value Date   WBC 11.9 (H) 07/22/2018   HGB 9.1 (L) 07/22/2018   HCT 27.8 (L) 07/22/2018   MCV 88.0 07/22/2018   PLT 158 07/22/2018    CMP Latest Ref Rng & Units 07/22/2018  Glucose 70 - 99 mg/dL 90  BUN 6 - 20 mg/dL 7  Creatinine 0.86 - 5.78 mg/dL 4.69  Sodium 629 - 528 mmol/L 134(L)  Potassium 3.5 - 5.1 mmol/L 3.9  Chloride 98 - 111 mmol/L 103  CO2 22 - 32 mmol/L 24  Calcium 8.9 - 10.3 mg/dL 7.2(L)  Total Protein 6.5 - 8.1 g/dL 4.7(L)  Total Bilirubin 0.3 - 1.2 mg/dL 0.5  Alkaline Phos 38 - 126 U/L 115  AST 15 - 41 U/L 47(H)  ALT 0 - 44 U/L 53(H)    Discharge instruction: per After Visit Summary and "Baby and Me Booklet".  After visit meds:  Allergies as of 07/25/2018      Reactions   Ambien [zolpidem Tartrate]    Visual hallucinations       Medication List    TAKE these medications   cyclobenzaprine 10 MG tablet Commonly known as:  FLEXERIL TAKE 1 TABLET(S) TWICE A DAY BY ORAL ROUTE AS  NEEDED FOR 20 DAYS.   esomeprazole 20 MG capsule Commonly known as:  NEXIUM Take 20 mg by mouth daily at 12 noon.   ibuprofen 600 MG tablet Commonly known as:  ADVIL,MOTRIN Take 1 tablet (600 mg total) by mouth every 6 (six) hours.   labetalol 300 MG tablet Commonly known as:  NORMODYNE Take 1 tablet (300 mg total) by mouth 2 (two) times daily. What changed:    medication strength  how much to take   NIFEdipine 30 MG 24 hr tablet Commonly known as:  PROCARDIA-XL/ADALAT CC Take 1 tablet (30 mg total) by mouth daily.   oxyCODONE 5 MG immediate release tablet Commonly known as:  Oxy IR/ROXICODONE Take 1 tablet (5 mg total) by mouth every 6 (six) hours as needed for severe pain.   prenatal multivitamin Tabs tablet Take 1 tablet by mouth daily at 12 noon. What changed:  medication strength       Diet: routine diet  Activity: Advance as tolerated. Pelvic rest for 6 weeks.   Outpatient follow up:1, 2 and 6 weeks Follow up Appt:No future appointments. Follow up Visit:No follow-ups on file.  Postpartum contraception: Undecided  Newborn Data: Live born female  Birth Weight: 6 lb 0.7 oz (2740  g) APGAR: 8, 8  Newborn Delivery   Birth date/time:  07/21/2018 13:46:00 Delivery type:  C-Section, Low Transverse Trial of labor:  No C-section categorization:  Primary     Baby Feeding: Bottle Disposition:NICU   07/25/2018 Sherian Rein, MD

## 2018-07-25 NOTE — Progress Notes (Signed)
Subjective: Postpartum Day 4: Cesarean Delivery Patient reports incisional pain, tolerating PO and no problems voiding.    Objective: Vital signs in last 24 hours: Temp:  [98 F (36.7 C)-98.2 F (36.8 C)] 98 F (36.7 C) (10/04 0748) Pulse Rate:  [67-81] 81 (10/04 0748) Resp:  [16] 16 (10/04 0748) BP: (133-152)/(77-94) 140/90 (10/04 0748) SpO2:  [99 %-100 %] 99 % (10/04 0748)  Physical Exam:  General: alert and no distress Lochia: appropriate Uterine Fundus: firm Incision: healing well DVT Evaluation: No evidence of DVT seen on physical exam.  No results for input(s): HGB, HCT in the last 72 hours.  Assessment/Plan: Status post Cesarean section. Doing well postoperatively.  Discharge home with standard precautions and return to clinic in 2 weeks. D/c with motrin, percocet and PNV  Rukiya Hodgkins Bovard-Stuckert 07/25/2018, 8:17 AM

## 2019-06-15 IMAGING — CR DG CHEST 2V
2 series · 2 of 2 positions shown · non-contrast
Comparison: None.

CLINICAL DATA: Thirty[age]five weeks pregnant, shielded chest pain and
short of breath

EXAM:
CHEST - 2 VIEW

[chest pa]
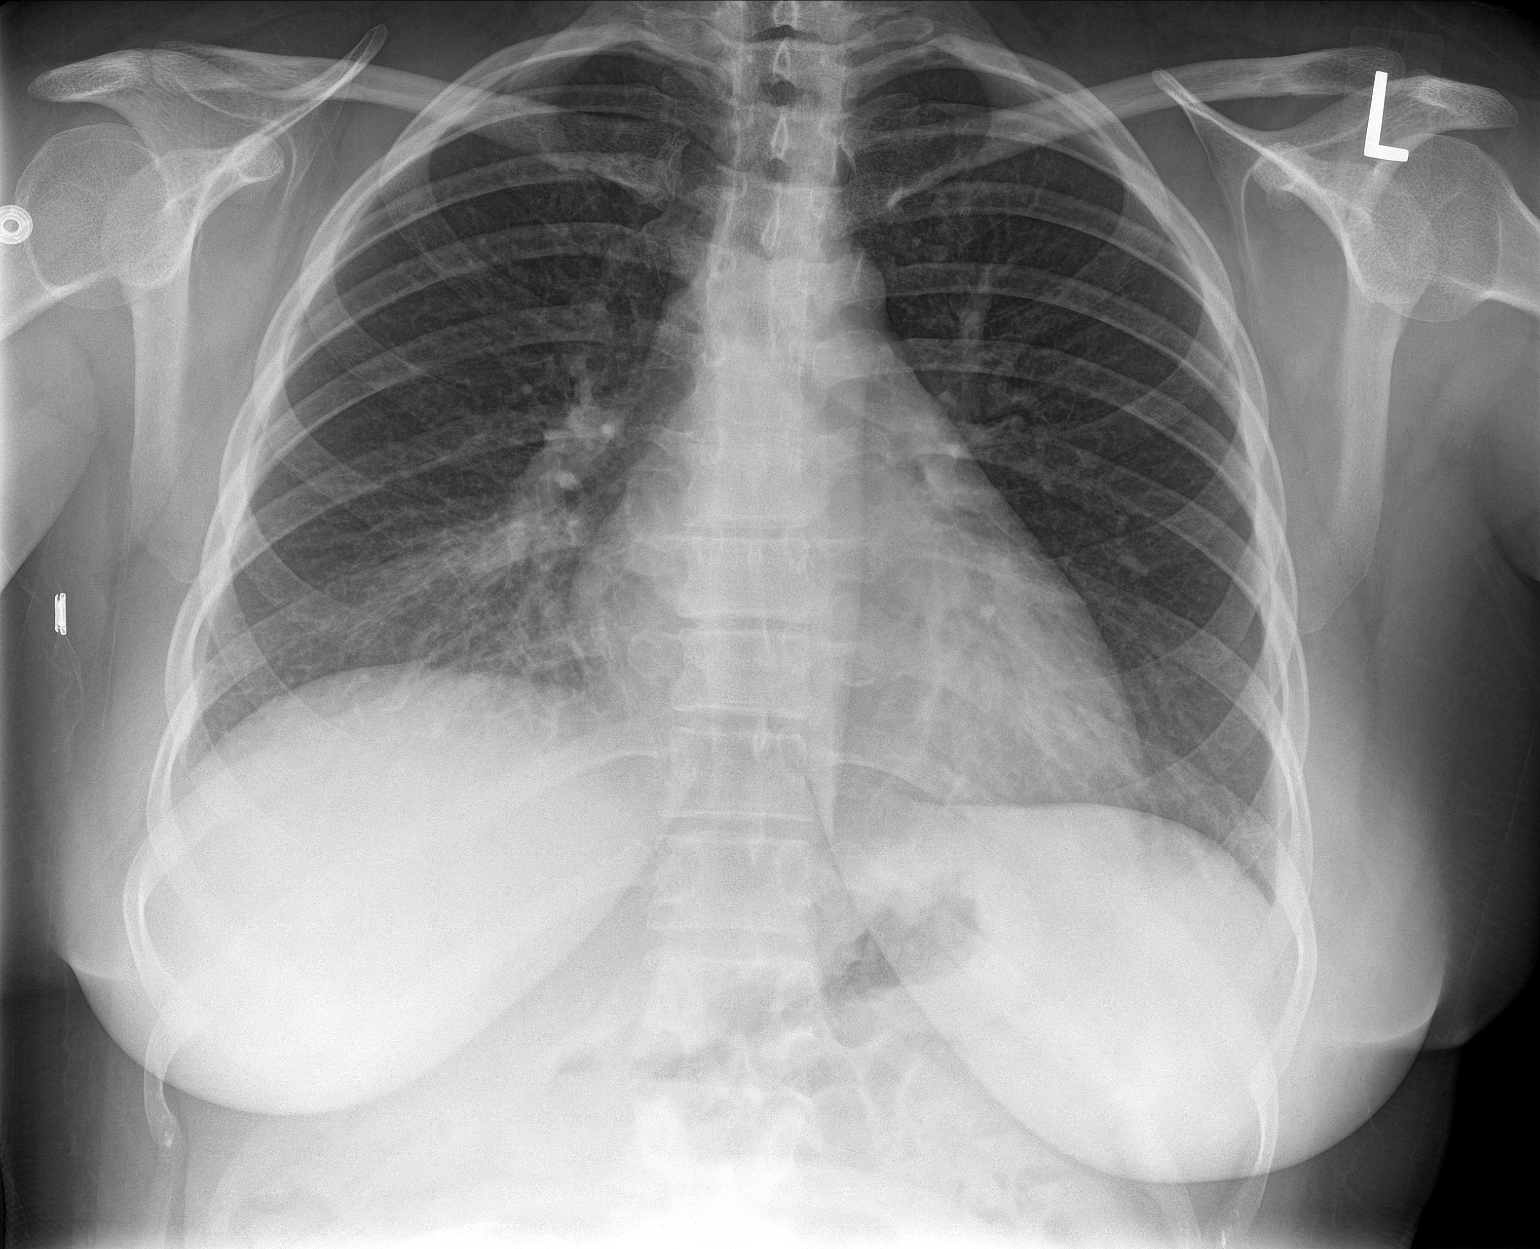

[chest lat]
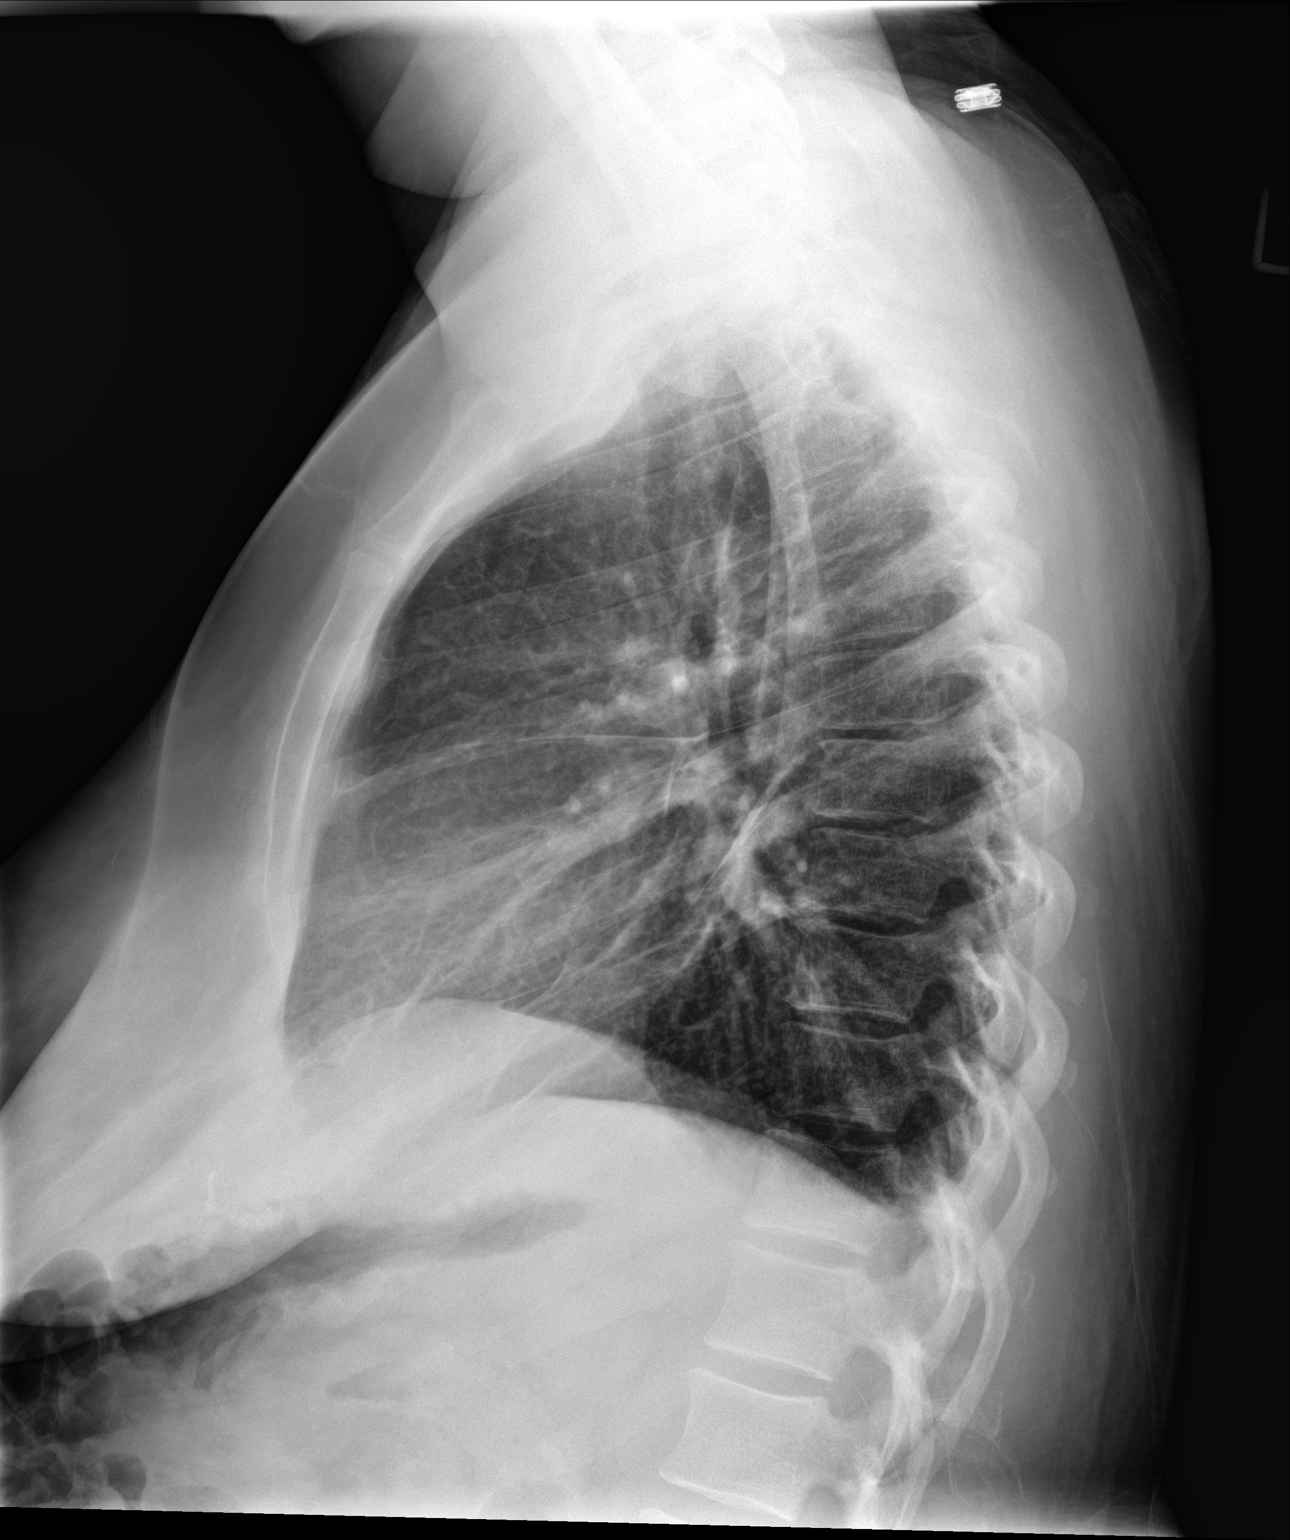

[2 of 2 positions shown; findings below may reference images not displayed]

FINDINGS: Low lung volumes. Streaky infrahilar opacity bilaterally. No pleural
effusion. Normal heart size. No pneumothorax.
IMPRESSION: Streaky infrahilar atelectasis or infiltrates bilaterally.
